# Patient Record
Sex: Female | Born: 1995 | Hispanic: Yes | Smoking: Never smoker
Health system: Southern US, Community
[De-identification: ages and names within clinical notes are randomized; demographics above are authoritative.]

---

## 2020-12-31 NOTE — L&D Delivery Note (Addendum)
OB/GYN Faculty Practice Delivery Note  Annette Thompson is a 25 y.o. G1P0 s/p SVD at [redacted]w[redacted]d. She was admitted for IOL for preE w/o SF.   ROM: 17h 89m with clear fluid GBS Status:  NEGATIVE/-- (07/05 1245) Maximum Maternal Temperature: 98.6  Labor Progress: Initial SVE: 1cm. Received cytotec and FB. Started on pit on 7/6 at 0300. AROM at 1100. Pit break from 1800-2200 and then restarted. She then progressed to complete.   Delivery Date/Time: 7/7 at 0400 Delivery: Called to room and patient was complete and pushing. Head delivered LOA. No nuchal cord present. Shoulder and body delivered in usual fashion. Infant with spontaneous cry, placed on mother's abdomen, dried and stimulated. Cord clamped x 2 after 1-minute delay, and cut by FOB. Cord blood drawn. Placenta delivered spontaneously with gentle cord traction. Fundus firm with massage and Pitocin and IM methergine. Labia, perineum, vagina, and cervix inspected inspected with first degree laceration, repaired.  Baby Weight: pending  Placenta: Sent to L&D Complications: None Lacerations: first degree, repaired EBL: 150 mL Analgesia: Epidural   Infant:  APGAR (1 MIN):  9 APGAR (5 MINS):  9 APGAR (10 MINS):     Casper Harrison, MD [facility] Family Medicine Fellow, [facility] for [facility], [facility] 07/06/2021, 4:27 AM

## 2021-01-13 ENCOUNTER — Other Ambulatory Visit: Payer: Self-pay

## 2021-01-13 ENCOUNTER — Emergency Department (HOSPITAL_COMMUNITY)
Admission: EM | Admit: 2021-01-13 | Discharge: 2021-01-13 | Disposition: A | Discharge status: Home / Self Care | Payer: BC Managed Care – PPO | Attending: Emergency Medicine | Admitting: Emergency Medicine

## 2021-01-13 ENCOUNTER — Encounter (HOSPITAL_COMMUNITY): Payer: Self-pay | Admitting: *Deleted

## 2021-01-13 ENCOUNTER — Ambulatory Visit: Payer: Self-pay

## 2021-01-13 DIAGNOSIS — R55 Syncope and collapse: Secondary | ICD-10-CM

## 2021-01-13 DIAGNOSIS — O26819 Pregnancy related exhaustion and fatigue, unspecified trimester: Secondary | ICD-10-CM | POA: Insufficient documentation

## 2021-01-13 DIAGNOSIS — Z3490 Encounter for supervision of normal pregnancy, unspecified, unspecified trimester: Secondary | ICD-10-CM

## 2021-01-13 DIAGNOSIS — Z3A00 Weeks of gestation of pregnancy not specified: Secondary | ICD-10-CM | POA: Diagnosis not present

## 2021-01-13 HISTORY — DX: Syncope and collapse: R55

## 2021-01-13 LAB — I-STAT CHEM 8, ED
BUN: 3 mg/dL — ABNORMAL LOW (ref 6–20)
Calcium, Ion: 1.21 mmol/L (ref 1.15–1.40)
Chloride: 103 mmol/L (ref 98–111)
Creatinine, Ser: 0.4 mg/dL — ABNORMAL LOW (ref 0.44–1.00)
Glucose, Bld: 82 mg/dL (ref 70–99)
HCT: 39 % (ref 36.0–46.0)
Hemoglobin: 13.3 g/dL (ref 12.0–15.0)
Potassium: 3.5 mmol/L (ref 3.5–5.1)
Sodium: 136 mmol/L (ref 135–145)
TCO2: 24 mmol/L (ref 22–32)

## 2021-01-13 LAB — I-STAT BETA HCG BLOOD, ED (MC, WL, AP ONLY): I-stat hCG, quantitative: >2000 m[IU]/mL — ABNORMAL HIGH (ref <5)

## 2021-01-13 MED ORDER — PRENATAL COMPLETE 14-0.4 MG PO TABS: 1.0000 | ORAL_TABLET | Freq: Every day | ORAL | 1 refills | Status: DC

## 2021-01-13 NOTE — Discharge Instructions (Addendum)
Start taking prenatal vitamins daily.  Follow-up with an OB/GYN doctor.

## 2021-01-13 NOTE — ED Triage Notes (Signed)
Syncopal episode at work on Wednesday, hit head, went to UC today and was sent here due to possible concussion. No more syncopal episodes since.

## 2021-01-13 NOTE — ED Notes (Signed)
ISTAT results not crossing over at this time. RN notified of results.

## 2021-01-13 NOTE — ED Provider Notes (Signed)
[facility]-EMERGENCY DEPT Provider Note   CSN: [id number] Arrival date & time: 01/13/21  1420     History Chief Complaint  Patient presents with  . Loss of Consciousness    Annette Thompson is a 25 y.o. female.  HPI   Patient presents to the ED for evaluation after a syncopal episode that she had at work on Wednesday.  Patient states she was at work when she suddenly started to feel very hot and flushed.  She felt lightheaded.  She felt as if she was going to pass out and the next thing she knew she was on the ground.  She thinks was only for a brief period of time but she did hit her head and does have a sore spot in the back of her head.  She denies a severe headache.  She is not having any numbness or weakness.  No nausea or vomiting.  No neck pain.  She has not had any further episodes since then.  Patient is not sure why this occurred.  She went to an urgent care today but they felt that she should be evaluated in the ED.  Patient has been vaccinated twice for COVID.  She is due to get a booster  History reviewed. No pertinent past medical history.  There are no problems to display for this patient.   History reviewed. No pertinent surgical history.   OB History   No obstetric history on file.     No family history on file.  Social History   Tobacco Use  . Smoking status: Never Smoker  . Smokeless tobacco: Never Used  Substance Use Topics  . Alcohol use: Never  . Drug use: Never    Home Medications Prior to Admission medications   Medication Sig Start Date End Date Taking? Authorizing Provider  Prenatal Vit-Fe Fumarate-FA (PRENATAL COMPLETE) 14-0.4 MG TABS Take 1 tablet by mouth daily. 01/13/21  Yes Linwood Dibbles, MD    Allergies    Patient has no known allergies.  Review of Systems   Review of Systems  All other systems reviewed and are negative.   Physical Exam Updated Vital Signs BP (!) 142/88 (BP Location: Left Arm)   Pulse (!)  109   Temp 98 F (36.7 C) (Oral)   Resp 16   Ht 1.549 m (5\' 1" )   Wt 70.3 kg   SpO2 98%   BMI 29.29 kg/m   Physical Exam Vitals and nursing note reviewed.  Constitutional:      General: She is not in acute distress.    Appearance: She is well-developed and well-nourished.  HENT:     Head: Normocephalic.     Comments: Mild tenderness palpation posterior occiput    Right Ear: External ear normal.     Left Ear: External ear normal.  Eyes:     General: No scleral icterus.       Right eye: No discharge.        Left eye: No discharge.     Conjunctiva/sclera: Conjunctivae normal.  Neck:     Trachea: No tracheal deviation.  Cardiovascular:     Rate and Rhythm: Normal rate and regular rhythm.     Pulses: Intact distal pulses.  Pulmonary:     Effort: Pulmonary effort is normal. No respiratory distress.     Breath sounds: Normal breath sounds. No stridor. No wheezing or rales.  Abdominal:     General: Bowel sounds are normal. There is no distension.  Palpations: Abdomen is soft.     Tenderness: There is no abdominal tenderness. There is no guarding or rebound.  Musculoskeletal:        General: No tenderness or edema.     Cervical back: Neck supple.  Skin:    General: Skin is warm and dry.     Findings: No rash.  Neurological:     Mental Status: She is alert.     Cranial Nerves: No cranial nerve deficit (no facial droop, extraocular movements intact, no slurred speech).     Sensory: No sensory deficit.     Motor: No abnormal muscle tone or seizure activity.     Coordination: Coordination normal.     Deep Tendon Reflexes: Strength normal.  Psychiatric:        Mood and Affect: Mood and affect normal.     ED Results / Procedures / Treatments   Labs (all labs ordered are listed, but only abnormal results are displayed) Labs Reviewed  I-STAT BETA HCG BLOOD, ED ([facility], [facility], [facility] ONLY) - Abnormal; Notable for the following components:      Result Value   I-stat hCG,  quantitative >2,000.0 (*)    All other components within normal limits  I-STAT CHEM 8, ED - Abnormal; Notable for the following components:   BUN 3 (*)    Creatinine, Ser 0.40 (*)    All other components within normal limits    EKG EKG Interpretation  Date/Time:  Friday January 13 2021 16:44:54 EST Ventricular Rate:  74 PR Interval:    QRS Duration: 75 QT Interval:  351 QTC Calculation: 390 R Axis:   47 Text Interpretation: Sinus rhythm Short PR interval 12 Lead; Mason-Likar No old tracing to compare Confirmed by Linwood Dibbles [phone]) on 01/13/2021 5:08:45 PM   Radiology No results found.  Procedures Procedures (including critical care time)  Medications Ordered in ED Medications - No data to display  ED Course  I have reviewed the triage vital signs and the nursing notes.  Pertinent labs & imaging results that were available during my care of the patient were reviewed by me and considered in my medical decision making (see chart for details).    MDM Rules/Calculators/A&P                         Patient presents to the ED for evaluation after a syncopal episode.  Patient appears of had a prodrome.  Sounds vasovagal in nature.  No clear trigger however.  We will plan on checking hemoglobin electrolytes and pregnancy test.  We will also add on EKG.  Regarding the head injury the syncopal episode preceded her head injury.  Patient is not having a severe headache.  She has not had any nausea vomiting.  It has been 2 days since the event.  I do not think head CT is necessary at this time.  Discussed this with the patient.  Patient's i-STAT shows normal hemoglobin.  Electrolytes are unremarkable.  EKG shows a normal rhythm.  Patient's pregnancy test is positive.  Suspect this was the contributing factor to her vasovagal episode.  Patient is not exactly sure when her last menstrual period was.  She is not complaining of abdominal pain.  No current vaginal bleeding.  Recommend outpatient  follow-up with OB/GYN.  Warning signs precautions discussed.  I will have patient start on prenatal vitamins. Final Clinical Impression(s) / ED Diagnoses Final diagnoses:  Pregnancy, unspecified gestational age  Syncope, unspecified syncope  type    Rx / DC Orders ED Discharge Orders         Ordered    Prenatal Vit-Fe Fumarate-FA (PRENATAL COMPLETE) 14-0.4 MG TABS  Daily        01/13/21 1729           Linwood Dibbles, MD 01/13/21 1732

## 2021-01-27 ENCOUNTER — Ambulatory Visit (INDEPENDENT_AMBULATORY_CARE_PROVIDER_SITE_OTHER): Payer: BC Managed Care – PPO

## 2021-01-27 ENCOUNTER — Other Ambulatory Visit: Payer: Self-pay

## 2021-01-27 VITALS — BP 122/74 | HR 81 | Ht 61.0 in | Wt 150.0 lb

## 2021-01-27 DIAGNOSIS — Z3402 Encounter for supervision of normal first pregnancy, second trimester: Secondary | ICD-10-CM

## 2021-01-27 DIAGNOSIS — O3680X0 Pregnancy with inconclusive fetal viability, not applicable or unspecified: Secondary | ICD-10-CM

## 2021-01-27 DIAGNOSIS — Z3403 Encounter for supervision of normal first pregnancy, third trimester: Secondary | ICD-10-CM | POA: Insufficient documentation

## 2021-01-27 DIAGNOSIS — Z3687 Encounter for antenatal screening for uncertain dates: Secondary | ICD-10-CM | POA: Diagnosis not present

## 2021-01-27 NOTE — Progress Notes (Signed)
PRENATAL INTAKE SUMMARY  Ms. Vonnie Spagnolo presents today New OB Nurse Interview.  OB History    Gravida  1   Para      Term      Preterm      AB      Living        SAB      IAB      Ectopic      Multiple      Live Births             I have reviewed the patient's medical, obstetrical, social, and family histories, medications, and available lab results.  SUBJECTIVE She has no unusual complaints  OBJECTIVE Initial Physical Exam (New OB)  GENERAL APPEARANCE: alert, well appearing   ASSESSMENT Normal pregnancy  PLAN Prenatal care to be completed at [place] labs to be completed at [facility] provider visit Blood Pressure kit given Baby scripts ordered PHQ 2 score: 0 GAD 7 score: 0 U/S performed today reveals single live IUP at 33w0dby u/s. FHR 159

## 2021-01-27 NOTE — Progress Notes (Signed)
Patient was assessed and managed by nursing staff during this encounter. I have reviewed the chart and agree with the documentation and plan. I have also made any necessary editorial changes.  Warden Fillers, MD 01/27/2021 12:09 PM

## 2021-02-14 ENCOUNTER — Encounter: Payer: Self-pay | Admitting: Obstetrics

## 2021-02-14 ENCOUNTER — Other Ambulatory Visit: Payer: Self-pay

## 2021-02-14 ENCOUNTER — Ambulatory Visit (INDEPENDENT_AMBULATORY_CARE_PROVIDER_SITE_OTHER): Payer: BC Managed Care – PPO | Admitting: Obstetrics

## 2021-02-14 ENCOUNTER — Other Ambulatory Visit (HOSPITAL_COMMUNITY)
Admission: RE | Admit: 2021-02-14 | Discharge: 2021-02-14 | Disposition: A | Discharge status: Home / Self Care | Payer: BC Managed Care – PPO | Source: Ambulatory Visit | Attending: Obstetrics | Admitting: Obstetrics

## 2021-02-14 VITALS — BP 122/82 | HR 85 | Wt 151.0 lb

## 2021-02-14 DIAGNOSIS — Z3482 Encounter for supervision of other normal pregnancy, second trimester: Secondary | ICD-10-CM | POA: Diagnosis present

## 2021-02-14 DIAGNOSIS — Z3480 Encounter for supervision of other normal pregnancy, unspecified trimester: Secondary | ICD-10-CM | POA: Diagnosis not present

## 2021-02-14 NOTE — Progress Notes (Signed)
Subjective:    Annette Thompson is being seen today for her first obstetrical visit.  This is a planned pregnancy. She is at [redacted]w[redacted]d gestation. Her obstetrical history is significant for none. Relationship with FOB: supportive. Patient does intend to breast feed. Pregnancy history fully reviewed.  The information documented in the HPI was reviewed and verified.  Menstrual History: OB History    Gravida  1   Para      Term      Preterm      AB      Living        SAB      IAB      Ectopic      Multiple      Live Births               Patient's last menstrual period was 10/16/2020.    Past Medical History:  Diagnosis Date  . Syncope 01/13/2021    History reviewed. No pertinent surgical history.  (Not in a hospital admission)  No Known Allergies  Social History   Tobacco Use  . Smoking status: Never Smoker  . Smokeless tobacco: Never Used  Substance Use Topics  . Alcohol use: Not Currently    History reviewed. No pertinent family history.   Review of Systems Constitutional: negative for weight loss Gastrointestinal: negative for vomiting Genitourinary:negative for genital lesions and vaginal discharge and dysuria Musculoskeletal:negative for back pain Behavioral/Psych: negative for abusive relationship, depression, illegal drug usage and tobacco use    Objective:    BP 122/82   Pulse 85   Wt 151 lb (68.5 kg)   LMP 10/16/2020   BMI 28.53 kg/m  General Appearance:    Alert, cooperative, no distress, appears stated age  Head:    Normocephalic, without obvious abnormality, atraumatic  Eyes:    PERRL, conjunctiva/corneas clear, EOM's intact, fundi    benign, both eyes  Ears:    Normal TM's and external ear canals, both ears  Nose:   Nares normal, septum midline, mucosa normal, no drainage    or sinus tenderness  Throat:   Lips, mucosa, and tongue normal; teeth and gums normal  Neck:   Supple, symmetrical, trachea midline, no adenopathy;    thyroid:   no enlargement/tenderness/nodules; no carotid   bruit or JVD  Back:     Symmetric, no curvature, ROM normal, no CVA tenderness  Lungs:     Clear to auscultation bilaterally, respirations unlabored  Chest Wall:    No tenderness or deformity   Heart:    Regular rate and rhythm, S1 and S2 normal, no murmur, rub   or gallop  Breast Exam:    No tenderness, masses, or nipple abnormality  Abdomen:     Soft, non-tender, bowel sounds active all four quadrants,    no masses, no organomegaly  Genitalia:    Normal female without lesion, discharge or tenderness  Extremities:   Extremities normal, atraumatic, no cyanosis or edema  Pulses:   2+ and symmetric all extremities  Skin:   Skin color, texture, turgor normal, no rashes or lesions  Lymph nodes:   Cervical, supraclavicular, and axillary nodes normal  Neurologic:   CNII-XII intact, normal strength, sensation and reflexes    throughout      Lab Review Urine pregnancy test Labs reviewed yes Radiologic studies reviewed no  Assessment:    Pregnancy at [redacted]w[redacted]d weeks    Plan:     1. Supervision of other normal pregnancy, antepartum Rx: -  Cytology - PAP( [place]) - CBC/D/Plt+RPR+Rh+ABO+Rub Ab... - Culture, OB Urine - Genetic Screening - AFP, Serum, Open Spina Bifida - [place] MFM OB COMP + 14 WK; Future  Prenatal vitamins.  Counseling provided regarding continued use of seat belts, cessation of alcohol consumption, smoking or use of illicit drugs; infection precautions i.e., influenza/TDAP immunizations, toxoplasmosis,CMV, parvovirus, listeria and varicella; workplace safety, exercise during pregnancy; routine dental care, safe medications, sexual activity, hot tubs, saunas, pools, travel, caffeine use, fish and methlymercury, potential toxins, hair treatments, varicose veins Weight gain recommendations per IOM guidelines reviewed: underweight/BMI< 18.5--> gain 28 - 40 lbs; normal weight/BMI 18.5 - 24.9--> gain 25 - 35 lbs; overweight/BMI 25 -  29.9--> gain 15 - 25 lbs; obese/BMI >30->gain  11 - 20 lbs Problem list reviewed and updated. FIRST/CF mutation testing/NIPT/QUAD SCREEN/fragile X/Ashkenazi Jewish population testing/Spinal muscular atrophy discussed: requested. Role of ultrasound in pregnancy discussed; fetal survey: requested. Amniocentesis discussed: not indicated.   Orders Placed This Encounter  Procedures  . Culture, OB Urine  . CBC/D/Plt+RPR+Rh+ABO+Rub Ab...  . Genetic Screening  . AFP, Serum, Open Spina Bifida    Order Specific Question:   Is patient insulin dependent?    Answer:   No    Order Specific Question:   Gestational Age (GA), weeks    Answer:   17.2    Order Specific Question:   Date on which patient was at this GA    Answer:   02/14/2021    Order Specific Question:   GA Calculation Method    Answer:   LMP    Order Specific Question:   Number of fetuses    Answer:   1    Follow up in 4 weeks. 50% of 20 min visit spent on counseling and coordination of care.    Brock Bad, MD 02/14/2021 3:50 PM

## 2021-02-15 ENCOUNTER — Other Ambulatory Visit (HOSPITAL_COMMUNITY)
Admission: RE | Admit: 2021-02-15 | Discharge: 2021-02-15 | Disposition: A | Discharge status: Home / Self Care | Payer: BC Managed Care – PPO | Source: Ambulatory Visit | Attending: Obstetrics | Admitting: Obstetrics

## 2021-02-15 DIAGNOSIS — B373 Candidiasis of vulva and vagina: Secondary | ICD-10-CM | POA: Insufficient documentation

## 2021-02-15 DIAGNOSIS — Z3A17 17 weeks gestation of pregnancy: Secondary | ICD-10-CM | POA: Insufficient documentation

## 2021-02-15 DIAGNOSIS — O23592 Infection of other part of genital tract in pregnancy, second trimester: Secondary | ICD-10-CM | POA: Insufficient documentation

## 2021-02-15 DIAGNOSIS — Z3480 Encounter for supervision of other normal pregnancy, unspecified trimester: Secondary | ICD-10-CM | POA: Diagnosis not present

## 2021-02-15 DIAGNOSIS — Z3482 Encounter for supervision of other normal pregnancy, second trimester: Secondary | ICD-10-CM | POA: Diagnosis present

## 2021-02-15 LAB — CYTOLOGY - PAP: Diagnosis: NEGATIVE

## 2021-02-15 NOTE — Addendum Note (Signed)
Addended by: Kennon Portela on: 02/15/2021 10:49 AM   Modules accepted: Orders

## 2021-02-16 LAB — CERVICOVAGINAL ANCILLARY ONLY
Bacterial Vaginitis (gardnerella): NEGATIVE
Candida Glabrata: NEGATIVE
Candida Vaginitis: POSITIVE — AB
Chlamydia: NEGATIVE
Comment: NEGATIVE
Comment: NEGATIVE
Comment: NEGATIVE
Comment: NEGATIVE
Comment: NEGATIVE
Comment: NORMAL
Neisseria Gonorrhea: NEGATIVE
Trichomonas: NEGATIVE

## 2021-02-16 LAB — CULTURE, OB URINE

## 2021-02-16 LAB — URINE CULTURE, OB REFLEX

## 2021-02-17 LAB — CBC/D/PLT+RPR+RH+ABO+RUB AB...
Antibody Screen: NEGATIVE
Basophils Absolute: 0.1 10*3/uL (ref 0.0–0.2)
Basos: 1 %
EOS (ABSOLUTE): 0.1 10*3/uL (ref 0.0–0.4)
Eos: 1 %
HCV Ab: <0.1 s/co ratio (ref 0.0–0.9)
HIV Screen 4th Generation wRfx: NONREACTIVE
Hematocrit: 36.1 % (ref 34.0–46.6)
Hemoglobin: 12.1 g/dL (ref 11.1–15.9)
Hepatitis B Surface Ag: NEGATIVE
Immature Grans (Abs): 0 10*3/uL (ref 0.0–0.1)
Immature Granulocytes: 0 %
Lymphocytes Absolute: 2 10*3/uL (ref 0.7–3.1)
Lymphs: 20 %
MCH: 28.5 pg (ref 26.6–33.0)
MCHC: 33.5 g/dL (ref 31.5–35.7)
MCV: 85 fL (ref 79–97)
Monocytes Absolute: 0.5 10*3/uL (ref 0.1–0.9)
Monocytes: 4 %
Neutrophils Absolute: 7.6 10*3/uL — ABNORMAL HIGH (ref 1.4–7.0)
Neutrophils: 74 %
Platelets: 181 10*3/uL (ref 150–450)
RBC: 4.24 x10E6/uL (ref 3.77–5.28)
RDW: 13.3 % (ref 11.7–15.4)
RPR Ser Ql: REACTIVE — AB
Rh Factor: POSITIVE
Rubella Antibodies, IGG: <0.9 index — ABNORMAL LOW (ref >0.99)
WBC: 10.2 10*3/uL (ref 3.4–10.8)

## 2021-02-17 LAB — HCV INTERPRETATION

## 2021-02-17 LAB — AFP, SERUM, OPEN SPINA BIFIDA
AFP MoM: 1.23
AFP Value: 48.4 ng/mL
Gest. Age on Collection Date: 17.2 weeks
Maternal Age At EDD: 25.7 yr
OSBR Risk 1 IN: 5926
Test Results:: NEGATIVE
Weight: 151 [lb_av]

## 2021-02-17 LAB — RPR, QUANT+TP ABS (REFLEX)
Rapid Plasma Reagin, Quant: 1:1 {titer} — ABNORMAL HIGH
T Pallidum Abs: NONREACTIVE

## 2021-02-21 ENCOUNTER — Encounter: Payer: Self-pay | Admitting: Obstetrics

## 2021-02-28 ENCOUNTER — Encounter: Payer: Self-pay | Admitting: Obstetrics

## 2021-03-02 ENCOUNTER — Ambulatory Visit: Payer: BC Managed Care – PPO | Attending: Obstetrics and Gynecology

## 2021-03-02 ENCOUNTER — Other Ambulatory Visit: Payer: Self-pay

## 2021-03-02 DIAGNOSIS — Z3480 Encounter for supervision of other normal pregnancy, unspecified trimester: Secondary | ICD-10-CM | POA: Diagnosis present

## 2021-03-14 ENCOUNTER — Ambulatory Visit (INDEPENDENT_AMBULATORY_CARE_PROVIDER_SITE_OTHER): Payer: BC Managed Care – PPO | Admitting: Obstetrics and Gynecology

## 2021-03-14 ENCOUNTER — Other Ambulatory Visit: Payer: Self-pay

## 2021-03-14 VITALS — BP 123/78 | HR 86 | Wt 153.0 lb

## 2021-03-14 DIAGNOSIS — Z3480 Encounter for supervision of other normal pregnancy, unspecified trimester: Secondary | ICD-10-CM

## 2021-03-14 DIAGNOSIS — Z3A21 21 weeks gestation of pregnancy: Secondary | ICD-10-CM

## 2021-03-14 MED ORDER — CLOTRIMAZOLE 1 % VA CREA: 1.0000 | TOPICAL_CREAM | Freq: Every day | VAGINAL | 0 refills | Status: AC

## 2021-03-14 NOTE — Progress Notes (Signed)
° °  PRENATAL VISIT NOTE  Subjective:  Annette Thompson is a 25 y.o. G1P0 at [redacted]w[redacted]d being seen today for ongoing prenatal care.  She is currently monitored for the following issues for this low-risk pregnancy and has Encounter for supervision of normal first pregnancy in second trimester on their problem list.  Patient reports vaginal discharge. Had tested positive for candida at last visit but was not given medication. Otherwise doing well.  Contractions: Not present. Vag. Bleeding: None.  Movement: Present. Denies leaking of fluid.   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   The following portions of the patient's history were reviewed and updated as appropriate: allergies, current medications, past family history, past medical history, past social history, past surgical history and problem list.   Objective:   Vitals:   03/14/21 1547  BP: 123/78  Pulse: 86  Weight: 153 lb (69.4 kg)    Fetal Status: Fetal Heart Rate (bpm): 156   Movement: Present     General:  Alert, oriented and cooperative. Patient is in no acute distress.  Skin: Skin is warm and dry. No rash noted.   Cardiovascular: Normal heart rate noted  Respiratory: Normal respiratory effort, no problems with respiration noted  Abdomen: Soft, gravid, appropriate for gestational age.  Pain/Pressure: Absent     Pelvic: Cervical exam deferred        Extremities: Normal range of motion.  Edema: None  Mental Status: Normal mood and affect. Normal behavior. Normal judgment and thought content.   Assessment and Plan:  Pregnancy: G1P0 at [redacted]w[redacted]d 1. Supervision of other normal pregnancy, antepartum -Doing well overall. Normal anatomy scan.  -script sent for vaginal clotrimazole for yeast infection  -  2. [redacted] weeks gestation of pregnancy   Preterm labor symptoms and general obstetric precautions including but not limited to vaginal bleeding, contractions, leaking of fluid and fetal movement were reviewed in detail with the patient. Please  refer to After Visit Summary for other counseling recommendations.   Return in about 4 weeks (around 04/11/2021) for OB.  No future appointments.  Gita Kudo, MD

## 2021-03-14 NOTE — Progress Notes (Signed)
+   Fetal movement. Pt is c/o increased acid reflux.

## 2021-03-14 NOTE — Patient Instructions (Addendum)
Safe Medications in Pregnancy   Acne:  Benzoyl Peroxide  Salicylic Acid   Backache/Headache:  Tylenol: 2 regular strength every 4 hours OR        2 Extra strength every 6 hours   Colds/Coughs/Allergies:  Benadryl (alcohol free) 25 mg every 6 hours as needed  Breath right strips  Claritin  Cepacol throat lozenges  Chloraseptic throat spray  Cold-Eeze- up to three times per day  Cough drops, alcohol free  Flonase (by prescription only)  Guaifenesin  Mucinex  Robitussin DM (plain only, alcohol free)  Saline nasal spray/drops  Sudafed (pseudoephedrine) & Actifed * use only after [redacted] weeks gestation and if you do not have high blood pressure  Tylenol  Vicks Vaporub  Zinc lozenges  Zyrtec   Constipation:  Colace  Ducolax suppositories  Fleet enema  Glycerin suppositories  Metamucil  Milk of magnesia  Miralax  Senokot  Smooth move tea   Diarrhea:  Kaopectate  Imodium A-D   *NO pepto Bismol   Hemorrhoids:  Anusol  Anusol HC  Preparation H  Tucks   Indigestion:  Tums  Maalox  Mylanta  Zantac  Pepcid   Insomnia:  Benadryl (alcohol free) 25mg  every 6 hours as needed  Tylenol PM  Unisom, no Gelcaps   Leg Cramps:  Tums  MagGel   Nausea/Vomiting:  Bonine  Dramamine  Emetrol  Ginger extract  Sea bands  Meclizine  Nausea medication to take during pregnancy:  Unisom (doxylamine succinate 25 mg tablets) Take one tablet daily at bedtime. If symptoms are not adequately controlled, the dose can be increased to a maximum recommended dose of two tablets daily (1/2 tablet in the morning, 1/2 tablet mid-afternoon and one at bedtime).  Vitamin B6 100mg  tablets. Take one tablet twice a day (up to 200 mg per day).   Skin Rashes:  Aveeno products  Benadryl cream or 25mg  every 6 hours as needed  Calamine Lotion  1% cortisone cream   Yeast infection:  Gyne-lotrimin 7 day course Monistat 7 day course    **If taking multiple medications, please check  labels to avoid duplicating the same active ingredients  **take medication as directed on the label  ** Do not exceed 4000 mg of tylenol in 24 hours  **Do not take medications that contain aspirin or ibuprofen

## 2021-03-14 NOTE — Addendum Note (Signed)
Addended by: Charlsie Quest B on: 03/14/2021 04:09 PM   Modules accepted: Orders

## 2021-03-16 LAB — AFP, SERUM, OPEN SPINA BIFIDA
AFP MoM: 1.32
AFP Value: 87 ng/mL
Gest. Age on Collection Date: 21.2 weeks
Maternal Age At EDD: 25.7 yr
OSBR Risk 1 IN: 4529
Test Results:: NEGATIVE
Weight: 153 [lb_av]

## 2021-04-11 ENCOUNTER — Other Ambulatory Visit: Payer: Self-pay

## 2021-04-11 ENCOUNTER — Ambulatory Visit (INDEPENDENT_AMBULATORY_CARE_PROVIDER_SITE_OTHER): Payer: BC Managed Care – PPO

## 2021-04-11 ENCOUNTER — Encounter: Payer: Self-pay | Admitting: Nurse Practitioner

## 2021-04-11 VITALS — BP 118/77 | HR 94 | Wt 159.0 lb

## 2021-04-11 DIAGNOSIS — Z3402 Encounter for supervision of normal first pregnancy, second trimester: Secondary | ICD-10-CM

## 2021-04-11 DIAGNOSIS — R12 Heartburn: Secondary | ICD-10-CM

## 2021-04-11 DIAGNOSIS — Z3A25 25 weeks gestation of pregnancy: Secondary | ICD-10-CM

## 2021-04-11 DIAGNOSIS — O26892 Other specified pregnancy related conditions, second trimester: Secondary | ICD-10-CM

## 2021-04-11 NOTE — Progress Notes (Signed)
ROB [redacted]w[redacted]d  AFP done 03/14/21  Pt has FMLA forms pt made aware to drop forms off at check out they will make her aware of fee and get her name and info then give to nurse that handles FMLA. Pt asked to allow 7-10 business days.   CC: None

## 2021-04-11 NOTE — Progress Notes (Signed)
   PRENATAL VISIT NOTE  Subjective:  Annette Thompson is a 25 y.o. G1P0 at [redacted]w[redacted]d being seen today for ongoing prenatal care.  She is currently monitored for the following issues for this low-risk pregnancy and has Encounter for supervision of normal first pregnancy in second trimester on their problem list.  Patient reports worsening heartburn. Uses Tums which sometimes relieves it. .  Contractions: Not present. Vag. Bleeding: None.  Movement: Present. Denies leaking of fluid.   The following portions of the patient's history were reviewed and updated as appropriate: allergies, current medications, past family history, past medical history, past social history, past surgical history and problem list.   Objective:   Vitals:   04/11/21 1557  BP: 118/77  Pulse: 94  Weight: 159 lb (72.1 kg)    Fetal Status: Fetal Heart Rate (bpm): 154 Fundal Height: 25 cm Movement: Present     General:  Alert, oriented and cooperative. Patient is in no acute distress.  Skin: Skin is warm and dry. No rash noted.   Cardiovascular: Normal heart rate noted  Respiratory: Normal respiratory effort, no problems with respiration noted  Abdomen: Soft, gravid, appropriate for gestational age.  Pain/Pressure: Present     Pelvic: Cervical exam deferred        Extremities: Normal range of motion.  Edema: None  Mental Status: Normal mood and affect. Normal behavior. Normal judgment and thought content.   Assessment and Plan:  Pregnancy: G1P0 at [redacted]w[redacted]d 1. Encounter for supervision of normal first pregnancy in second trimester - Doing well - Anticipatory guidance for upcoming prenatal appointments - GTT and 28 week labs at next visit  2. [redacted] weeks gestation of pregnancy  3. Heartburn during pregnancy in second trimester - Uses Tums with some relief - Recommend Pepcid BID - Avoid trigger foods, encouraged small, frequent meals, avoid lying down after eating  Preterm labor symptoms and general obstetric  precautions including but not limited to vaginal bleeding, contractions, leaking of fluid and fetal movement were reviewed in detail with the patient. Please refer to After Visit Summary for other counseling recommendations.   Return in about 3 weeks (around 05/02/2021) for ROB and gtt.  Future Appointments  Date Time Provider Department Center  05/02/2021  8:00 AM [facility] None  05/02/2021 10:15 AM Hermina Staggers, MD [facility] None    Brand Males, CNM  4:26 PM

## 2021-04-24 ENCOUNTER — Telehealth: Payer: Self-pay

## 2021-04-24 NOTE — Telephone Encounter (Signed)
FMLA forms are complete LVM for pt to c/b for payment

## 2021-05-02 ENCOUNTER — Encounter: Payer: BC Managed Care – PPO | Admitting: Obstetrics and Gynecology

## 2021-05-02 ENCOUNTER — Other Ambulatory Visit: Payer: BC Managed Care – PPO

## 2021-05-05 ENCOUNTER — Other Ambulatory Visit: Payer: Self-pay

## 2021-05-05 ENCOUNTER — Other Ambulatory Visit: Payer: BC Managed Care – PPO

## 2021-05-05 ENCOUNTER — Ambulatory Visit (INDEPENDENT_AMBULATORY_CARE_PROVIDER_SITE_OTHER): Payer: BC Managed Care – PPO | Admitting: Student

## 2021-05-05 VITALS — BP 106/68 | HR 81 | Wt 159.0 lb

## 2021-05-05 DIAGNOSIS — Z3A28 28 weeks gestation of pregnancy: Secondary | ICD-10-CM

## 2021-05-05 DIAGNOSIS — Z3402 Encounter for supervision of normal first pregnancy, second trimester: Secondary | ICD-10-CM

## 2021-05-05 NOTE — Progress Notes (Signed)
   PRENATAL VISIT NOTE  Subjective:  Annette Thompson is a 25 y.o. G1P0 at [redacted]w[redacted]d being seen today for ongoing prenatal care.  She is currently monitored for the following issues for this low-risk pregnancy and has Encounter for supervision of normal first pregnancy in second trimester on their problem list.  Patient reports no complaints. She is asking about circucision and who will deliver her baby.   Contractions: Not present. Vag. Bleeding: None.  Movement: Present. Denies leaking of fluid.   The following portions of the patient's history were reviewed and updated as appropriate: allergies, current medications, past family history, past medical history, past social history, past surgical history and problem list.   Objective:   Vitals:   05/05/21 0820  BP: 106/68  Pulse: 81  Weight: 159 lb (72.1 kg)    Fetal Status: Fetal Heart Rate (bpm): 140 Fundal Height: 28 cm Movement: Present     General:  Alert, oriented and cooperative. Patient is in no acute distress.  Skin: Skin is warm and dry. No rash noted.   Cardiovascular: Normal heart rate noted  Respiratory: Normal respiratory effort, no problems with respiration noted  Abdomen: Soft, gravid, appropriate for gestational age.  Pain/Pressure: Absent     Pelvic: Cervical exam deferred        Extremities: Normal range of motion.     Mental Status: Normal mood and affect. Normal behavior. Normal judgment and thought content.   Assessment and Plan:  Pregnancy: G1P0 at [redacted]w[redacted]d 1. Encounter for supervision of normal first pregnancy in second trimester -Discussed circ, discussed depo shot/.  - Glucose Tolerance, 2 Hours w/1 Hour - CBC - HIV Antibody (routine testing w rflx) - RPR  Preterm labor symptoms and general obstetric precautions including but not limited to vaginal bleeding, contractions, leaking of fluid and fetal movement were reviewed in detail with the patient. Please refer to After Visit Summary for other counseling  recommendations.   Return in about 2 weeks (around 05/19/2021), or [facility].  No future appointments.  Marylene Land, CNM

## 2021-05-06 LAB — GLUCOSE TOLERANCE, 2 HOURS W/ 1HR
Glucose, 1 hour: 106 mg/dL (ref 65–179)
Glucose, 2 hour: 84 mg/dL (ref 65–152)
Glucose, Fasting: 74 mg/dL (ref 65–91)

## 2021-05-06 LAB — HIV ANTIBODY (ROUTINE TESTING W REFLEX): HIV Screen 4th Generation wRfx: NONREACTIVE

## 2021-05-06 LAB — CBC
Hematocrit: 35.4 % (ref 34.0–46.6)
Hemoglobin: 12.2 g/dL (ref 11.1–15.9)
MCH: 29.5 pg (ref 26.6–33.0)
MCHC: 34.5 g/dL (ref 31.5–35.7)
MCV: 86 fL (ref 79–97)
Platelets: 146 10*3/uL — ABNORMAL LOW (ref 150–450)
RBC: 4.13 x10E6/uL (ref 3.77–5.28)
RDW: 12.6 % (ref 11.7–15.4)
WBC: 7.6 10*3/uL (ref 3.4–10.8)

## 2021-05-06 LAB — RPR: RPR Ser Ql: NONREACTIVE

## 2021-05-08 ENCOUNTER — Encounter: Payer: Self-pay | Admitting: Student

## 2021-05-08 DIAGNOSIS — R768 Other specified abnormal immunological findings in serum: Secondary | ICD-10-CM | POA: Insufficient documentation

## 2021-05-08 DIAGNOSIS — O09899 Supervision of other high risk pregnancies, unspecified trimester: Secondary | ICD-10-CM | POA: Insufficient documentation

## 2021-05-08 DIAGNOSIS — Z2839 Other underimmunization status: Secondary | ICD-10-CM | POA: Insufficient documentation

## 2021-05-08 DIAGNOSIS — D696 Thrombocytopenia, unspecified: Secondary | ICD-10-CM | POA: Insufficient documentation

## 2021-05-18 ENCOUNTER — Other Ambulatory Visit: Payer: Self-pay

## 2021-05-18 ENCOUNTER — Ambulatory Visit (INDEPENDENT_AMBULATORY_CARE_PROVIDER_SITE_OTHER): Payer: BC Managed Care – PPO

## 2021-05-18 VITALS — BP 120/81 | HR 96 | Wt 161.0 lb

## 2021-05-18 DIAGNOSIS — Z3A30 30 weeks gestation of pregnancy: Secondary | ICD-10-CM

## 2021-05-18 DIAGNOSIS — D696 Thrombocytopenia, unspecified: Secondary | ICD-10-CM

## 2021-05-18 DIAGNOSIS — O99119 Other diseases of the blood and blood-forming organs and certain disorders involving the immune mechanism complicating pregnancy, unspecified trimester: Secondary | ICD-10-CM

## 2021-05-18 DIAGNOSIS — Z3402 Encounter for supervision of normal first pregnancy, second trimester: Secondary | ICD-10-CM

## 2021-05-18 NOTE — Progress Notes (Signed)
Pt has had a couple episodes of seeing spots, no other associated symptoms.

## 2021-05-18 NOTE — Progress Notes (Signed)
   LOW-RISK PREGNANCY OFFICE VISIT  Patient name: Annette Thompson MRN [id number]  Date of birth: 1996-04-02 Chief Complaint:   Routine Prenatal Visit  Subjective:   Annette Thompson is a 25 y.o. G1P0 female at [redacted]w[redacted]d with an Estimated Date of Delivery: 07/23/21 being seen today for ongoing management of a low-risk pregnancy aeb has Encounter for supervision of normal first pregnancy in second trimester; Biological false positive RPR test; Rubella non-immune status, antepartum; and Gestational thrombocytopenia (HCC) on their problem list.  Patient presents today with no complaints. Patient reports excitement with experiencing hiccups.  Patient endorses fetal movement. Patient denies abdominal cramping or contractions.  Patient denies vaginal concerns including abnormal discharge, leaking of fluid, and bleeding.  Contractions: Not present. Vag. Bleeding: None.  Movement: Present.  Patient reports a few incidents of seeing spots on 3 separate occassions.  She contributes it to inadequate hydration.   Reviewed past medical,surgical, social, obstetrical and family history as well as problem list, medications and allergies.  Objective   Vitals:   05/18/21 1604  BP: 120/81  Pulse: 96  Weight: 161 lb (73 kg)  Body mass index is 30.42 kg/m.  Total Weight Gain:1 lb (0.454 kg)         Physical Examination:   General appearance: Well appearing, and in no distress  Mental status: Alert, oriented to person, place, and time  Skin: Warm & dry  Cardiovascular: Normal heart rate noted  Respiratory: Normal respiratory effort, no distress  Abdomen: Soft, gravid, nontender, AGA with Fundal Height: 33 cm  Pelvic: Cervical exam deferred           Extremities:    Fetal Status: Fetal Heart Rate (bpm): 140  Movement: Present   No results found for this or any previous visit (from the past 24 hour(s)).  Assessment & Plan:  Low-risk pregnancy of a 25 y.o., G1P0 at [redacted]w[redacted]d with an Estimated Date of  Delivery: 07/23/21   1. Encounter for supervision of normal first pregnancy in second trimester -Anticipatory guidance for upcoming appts. -Patient to next appt in 2 weeks for an in-person visit. -Discussed FH at 49 which is slightly larger than expected. -Anatomy US fetus in 95%ile.  Will monitor and send for growth [place] if trend noted.   2. [redacted] weeks gestation of pregnancy -Doing well -Encouraged to monitor symptoms regarding spotty vision. -Given suggestion for eye exam!  3. Benign gestational thrombocytopenia, antepartum [facility]) -Reviewed previous labs and diagnosis of G. Thrombocytopenia -Informed that we will repeat labs at around 36 weeks. -Given informational sheet with warning that it is about general thrombocytopenia.      Meds: No orders of the defined types were placed in this encounter.  Labs/procedures today:  Lab Orders  No laboratory test(s) ordered today     Reviewed: Preterm labor symptoms and general obstetric precautions including but not limited to vaginal bleeding, contractions, leaking of fluid and fetal movement were reviewed in detail with the patient.  All questions were answered.  Follow-up: Return in about 2 weeks (around 06/01/2021) for LROB.  No orders of the defined types were placed in this encounter.  Cherre Robins MSN, CNM 05/18/2021

## 2021-05-18 NOTE — Patient Instructions (Signed)
Thrombocytopenia Thrombocytopenia is a condition in which you have a low number of platelets in your blood. Platelets are also called thrombocytes. Platelets are tiny cells in the blood. When you bleed, they clump together at the cut or injury to stop the bleeding. This is called blood clotting. Not having enough platelets can cause bleeding problems. Some cases of thrombocytopenia are mild while others are more severe. What are the causes? This condition may be caused by:  Decreased production of platelets. This may be caused by: ? Aplastic anemia. This is when your bone marrow stops making blood cells. ? Cancer in the bone marrow. ? Certain medicines, including chemotherapy. ? Infection in the bone marrow. ? Drinking a lot of alcohol.  Increased destruction of platelets. This may be caused by: ? Certain immune diseases. ? Certain medicines. ? Certain blood clotting disorders. ? Certain inherited disorders. ? Certain bleeding disorders. ? Pregnancy. ? Having an enlarged spleen (hypersplenism). In hypersplenism, the spleen gathers up platelets from circulation. This means that the platelets are not available to help with blood clotting. The spleen can be enlarged because of cirrhosis or other conditions. What are the signs or symptoms? Symptoms of this condition are the result of poor blood clotting. They will vary depending on how low the platelet counts are. Symptoms may include:  Abnormal bleeding.  Nosebleeds.  Heavy menstrual periods.  Blood in the urine or stool (feces).  A purplish discoloration in the skin (purpura).  Bruising.  A rash that looks like pinpoint, purplish-red spots (petechiae) on the skin and mucous membranes. How is this diagnosed? This condition may be diagnosed with blood tests and a physical exam. Sometimes, a sample of bone marrow may be removed to look for the original cells (megakaryocytes) that make platelets. Other tests may be needed depending on  the cause.   How is this treated? Treatment for this condition depends on the cause. Treatment options may include:  Treatment of another condition that is causing the low platelet count.  Medicines to help protect your platelets from being destroyed.  A replacement (transfusion) of platelets to stop or prevent bleeding.  Surgery to remove the spleen. Follow these instructions at home: Activity  Avoid activities that could cause injury or bruising, and follow instructions about how to prevent falls.  Take extra care not to cut yourself when you shave or when you use scissors, needles, knives, and other tools.  Take extra care to protect yourself from burns when ironing or cooking. General instructions  Check your skin and the inside of your mouth for bruising or bleeding as told by your health care provider.  Check your spit (sputum), urine, and stool for blood as told by your health care provider.  Do not drink alcohol.  Take over-the-counter and prescription medicines only as told by your health care provider.  Do not take any medicines that have aspirin or NSAIDs in them. These medicines can thin your blood and cause you to bleed more easily.  Tell all your health care providers, including dentists and eye doctors, about your condition.   Contact a health care provider if you have:  Unexplained bruising. Get help right away if you have:  Active bleeding from anywhere on your body.  Blood in your sputum, urine, or stool. Summary  Thrombocytopenia is a condition in which you have a low number of platelets in your blood.  Platelets are needed for blood clotting.  Symptoms of this condition are the result of poor blood   clotting and may include abnormal bleeding, nosebleeds, and bruising.  This condition may be diagnosed with blood tests and a physical exam.  Treatment for this condition depends on the cause. This information is not intended to replace advice given to  you by your health care provider. Make sure you discuss any questions you have with your health care provider. Document Revised: 09/18/2018 Document Reviewed: 09/18/2018 Elsevier Patient Education  2021 Elsevier Inc.  

## 2021-05-24 DIAGNOSIS — Z3480 Encounter for supervision of other normal pregnancy, unspecified trimester: Secondary | ICD-10-CM

## 2021-05-25 MED ORDER — PRENATAL COMPLETE 14-0.4 MG PO TABS: 1.0000 | ORAL_TABLET | Freq: Every day | ORAL | 3 refills | Status: DC

## 2021-06-01 ENCOUNTER — Other Ambulatory Visit: Payer: Self-pay

## 2021-06-01 ENCOUNTER — Ambulatory Visit (INDEPENDENT_AMBULATORY_CARE_PROVIDER_SITE_OTHER): Payer: BC Managed Care – PPO | Admitting: Advanced Practice Midwife

## 2021-06-01 VITALS — BP 126/80 | HR 90 | Wt 163.9 lb

## 2021-06-01 DIAGNOSIS — Z3480 Encounter for supervision of other normal pregnancy, unspecified trimester: Secondary | ICD-10-CM

## 2021-06-01 DIAGNOSIS — D696 Thrombocytopenia, unspecified: Secondary | ICD-10-CM

## 2021-06-01 DIAGNOSIS — O99119 Other diseases of the blood and blood-forming organs and certain disorders involving the immune mechanism complicating pregnancy, unspecified trimester: Secondary | ICD-10-CM

## 2021-06-01 DIAGNOSIS — Z3A32 32 weeks gestation of pregnancy: Secondary | ICD-10-CM

## 2021-06-01 NOTE — Patient Instructions (Signed)

## 2021-06-01 NOTE — Progress Notes (Signed)
   PRENATAL VISIT NOTE  Subjective:  Annette Thompson is a 25 y.o. G1P0 at [redacted]w[redacted]d being seen today for ongoing prenatal care.  She is currently monitored for the following issues for this low-risk pregnancy and has Encounter for supervision of normal first pregnancy in second trimester; Biological false positive RPR test; Rubella non-immune status, antepartum; and Gestational thrombocytopenia (HCC) on their problem list.  Patient reports no complaints.  Contractions: Not present. Vag. Bleeding: None.  Movement: Present. Denies leaking of fluid.   The following portions of the patient's history were reviewed and updated as appropriate: allergies, current medications, past family history, past medical history, past social history, past surgical history and problem list.   Objective:   Vitals:   06/01/21 1627  BP: 126/80  Pulse: 90  Weight: 163 lb 14.4 oz (74.3 kg)    Fetal Status: Fetal Heart Rate (bpm): 152 Fundal Height: 32 cm Movement: Present     General:  Alert, oriented and cooperative. Patient is in no acute distress.  Skin: Skin is warm and dry. No rash noted.   Cardiovascular: Normal heart rate noted  Respiratory: Normal respiratory effort, no problems with respiration noted  Abdomen: Soft, gravid, appropriate for gestational age.  Pain/Pressure: Absent     Pelvic: Cervical exam deferred        Extremities: Normal range of motion.  Edema: None  Mental Status: Normal mood and affect. Normal behavior. Normal judgment and thought content.   Assessment and Plan:  Pregnancy: G1P0 at [redacted]w[redacted]d 1. Supervision of other normal pregnancy, antepartum --Anticipatory guidance about next visits/weeks of pregnancy given. --Next visit in 2 weeks  2. [redacted] weeks gestation of pregnancy   3. Benign gestational thrombocytopenia, antepartum (HCC) --Plts 146 on 5/6, recheck at 36 weeks  Preterm labor symptoms and general obstetric precautions including but not limited to vaginal bleeding,  contractions, leaking of fluid and fetal movement were reviewed in detail with the patient. Please refer to After Visit Summary for other counseling recommendations.   Return in about 2 weeks (around 06/15/2021).  Future Appointments  Date Time Provider Department Center  06/15/2021  1:30 PM Marsala, Arlana Pouch, MD [facility] None    Sharen Counter, CNM

## 2021-06-15 ENCOUNTER — Encounter: Payer: BC Managed Care – PPO | Admitting: Obstetrics and Gynecology

## 2021-06-19 ENCOUNTER — Other Ambulatory Visit: Payer: Self-pay | Admitting: Advanced Practice Midwife

## 2021-06-19 ENCOUNTER — Other Ambulatory Visit: Payer: Self-pay

## 2021-06-19 ENCOUNTER — Ambulatory Visit (INDEPENDENT_AMBULATORY_CARE_PROVIDER_SITE_OTHER): Payer: BC Managed Care – PPO | Admitting: Advanced Practice Midwife

## 2021-06-19 VITALS — BP 120/85 | HR 87 | Wt 167.0 lb

## 2021-06-19 DIAGNOSIS — G5600 Carpal tunnel syndrome, unspecified upper limb: Secondary | ICD-10-CM

## 2021-06-19 DIAGNOSIS — O26899 Other specified pregnancy related conditions, unspecified trimester: Secondary | ICD-10-CM

## 2021-06-19 DIAGNOSIS — D696 Thrombocytopenia, unspecified: Secondary | ICD-10-CM

## 2021-06-19 DIAGNOSIS — O1203 Gestational edema, third trimester: Secondary | ICD-10-CM

## 2021-06-19 DIAGNOSIS — Z3480 Encounter for supervision of other normal pregnancy, unspecified trimester: Secondary | ICD-10-CM

## 2021-06-19 DIAGNOSIS — O99119 Other diseases of the blood and blood-forming organs and certain disorders involving the immune mechanism complicating pregnancy, unspecified trimester: Secondary | ICD-10-CM

## 2021-06-19 MED ORDER — PRENATAL COMPLETE 14-0.4 MG PO TABS: 1.0000 | ORAL_TABLET | Freq: Every day | ORAL | 3 refills | Status: AC

## 2021-06-19 NOTE — Progress Notes (Signed)
   PRENATAL VISIT NOTE  Subjective:  Annette Thompson is a 25 y.o. G1P0 at [redacted]w[redacted]d being seen today for ongoing prenatal care.  She is currently monitored for the following issues for this low-risk pregnancy and has Encounter for supervision of normal first pregnancy in second trimester; Biological false positive RPR test; Rubella non-immune status, antepartum; and Gestational thrombocytopenia (HCC) on their problem list.  Patient reports  edema .  Contractions: Not present. Vag. Bleeding: None.  Movement: Present. Denies leaking of fluid.   The following portions of the patient's history were reviewed and updated as appropriate: allergies, current medications, past family history, past medical history, past social history, past surgical history and problem list.   Objective:   Vitals:   06/19/21 1346  BP: 120/85  Pulse: 87  Weight: 167 lb (75.8 kg)    Fetal Status: Fetal Heart Rate (bpm): 140   Movement: Present     General:  Alert, oriented and cooperative. Patient is in no acute distress.  Skin: Skin is warm and dry. No rash noted.   Cardiovascular: Normal heart rate noted  Respiratory: Normal respiratory effort, no problems with respiration noted  Abdomen: Soft, gravid, appropriate for gestational age.  Pain/Pressure: Present     Pelvic: Cervical exam deferred        Extremities: Normal range of motion.  Edema: Trace  Mental Status: Normal mood and affect. Normal behavior. Normal judgment and thought content.   Assessment and Plan:  Pregnancy: G1P0 at [redacted]w[redacted]d 1. Benign gestational thrombocytopenia, antepartum (HCC) --Plts stable at 146 on 5/6.  Repeat today. - CBC  2. Supervision of other normal pregnancy, antepartum --Anticipatory guidance about next visits/weeks of pregnancy given. --Next visit in 2 weeks for GBS  - Prenatal Vit-Fe Fumarate-FA (PRENATAL COMPLETE) 14-0.4 MG TABS; Take 1 tablet by mouth daily.  Dispense: 60 tablet; Refill: 3  3. Edema during pregnancy in  third trimester --+2 Pitting BLE edema, no upper extremity edema. --BP wnl, no s/sx of PEC --elevate, compression, decrease sodium intake and increase PO fluids --Pt had field trip as teacher 2 weeks, ago, last week was travelling via plane to [place], plans to rest, elevate more this week. --Take BP on home cuff weekly, reviewed s/sx of PEC  4. Carpal tunnel syndrome during pregnancy --No visible edema of hands, pt wearing prepregnancy rings without difficulty. --Pt reports stiffness, pain when she wakes up in the morning in bilateral hands --Ice PRN, Tylenol PRN, consider wrist braces if needed  Preterm labor symptoms and general obstetric precautions including but not limited to vaginal bleeding, contractions, leaking of fluid and fetal movement were reviewed in detail with the patient. Please refer to After Visit Summary for other counseling recommendations.   Return in about 2 weeks (around 07/03/2021).  No future appointments.   Sharen Counter, CNM

## 2021-06-20 ENCOUNTER — Telehealth: Payer: Self-pay | Admitting: Advanced Practice Midwife

## 2021-06-20 LAB — CBC
Hematocrit: 35.9 % (ref 34.0–46.6)
Hemoglobin: 12.4 g/dL (ref 11.1–15.9)
MCH: 29.7 pg (ref 26.6–33.0)
MCHC: 34.5 g/dL (ref 31.5–35.7)
MCV: 86 fL (ref 79–97)
Platelets: 103 10*3/uL — ABNORMAL LOW (ref 150–450)
RBC: 4.18 x10E6/uL (ref 3.77–5.28)
RDW: 11.8 % (ref 11.7–15.4)
WBC: 8.4 10*3/uL (ref 3.4–10.8)

## 2021-06-20 NOTE — Telephone Encounter (Signed)
Pt with gestational thrombocytpenia, Plts previously 146.  Plts on 6/20 at 35 weeks were 103 and pt reported new onset BLE. BP was 120/85 with no s/sx of PEC. Consult Dr Adrian Blackwater who recommends weekly Plts.  Pt is watching BP at home. Pt next appt is currently scheduled for 2 weeks from now but she should be seen weekly and have the lab with a prenatal visit next week.

## 2021-06-29 ENCOUNTER — Encounter: Payer: BC Managed Care – PPO | Admitting: Advanced Practice Midwife

## 2021-07-04 ENCOUNTER — Inpatient Hospital Stay (HOSPITAL_COMMUNITY)
Admission: AD | Admit: 2021-07-04 | Discharge: 2021-07-08 | DRG: 806 | Disposition: A | Discharge status: Home / Self Care | Payer: BC Managed Care – PPO | Attending: Family Medicine | Admitting: Family Medicine

## 2021-07-04 ENCOUNTER — Encounter (HOSPITAL_COMMUNITY): Payer: Self-pay | Admitting: Family Medicine

## 2021-07-04 ENCOUNTER — Ambulatory Visit (INDEPENDENT_AMBULATORY_CARE_PROVIDER_SITE_OTHER): Payer: BC Managed Care – PPO | Admitting: Advanced Practice Midwife

## 2021-07-04 ENCOUNTER — Other Ambulatory Visit: Payer: Self-pay

## 2021-07-04 VITALS — BP 149/101 | HR 76 | Wt 168.8 lb

## 2021-07-04 DIAGNOSIS — Z3480 Encounter for supervision of other normal pregnancy, unspecified trimester: Secondary | ICD-10-CM

## 2021-07-04 DIAGNOSIS — Z3403 Encounter for supervision of normal first pregnancy, third trimester: Secondary | ICD-10-CM

## 2021-07-04 DIAGNOSIS — O3663X0 Maternal care for excessive fetal growth, third trimester, not applicable or unspecified: Secondary | ICD-10-CM | POA: Diagnosis present

## 2021-07-04 DIAGNOSIS — D696 Thrombocytopenia, unspecified: Secondary | ICD-10-CM

## 2021-07-04 DIAGNOSIS — Z3A37 37 weeks gestation of pregnancy: Secondary | ICD-10-CM | POA: Diagnosis not present

## 2021-07-04 DIAGNOSIS — Z20822 Contact with and (suspected) exposure to covid-19: Secondary | ICD-10-CM | POA: Diagnosis present

## 2021-07-04 DIAGNOSIS — R768 Other specified abnormal immunological findings in serum: Secondary | ICD-10-CM | POA: Diagnosis present

## 2021-07-04 DIAGNOSIS — O1403 Mild to moderate pre-eclampsia, third trimester: Secondary | ICD-10-CM

## 2021-07-04 DIAGNOSIS — R7689 Other specified abnormal immunological findings in serum: Secondary | ICD-10-CM | POA: Diagnosis present

## 2021-07-04 DIAGNOSIS — D6959 Other secondary thrombocytopenia: Secondary | ICD-10-CM | POA: Diagnosis present

## 2021-07-04 DIAGNOSIS — Z2839 Other underimmunization status: Secondary | ICD-10-CM

## 2021-07-04 DIAGNOSIS — O1404 Mild to moderate pre-eclampsia, complicating childbirth: Principal | ICD-10-CM | POA: Diagnosis present

## 2021-07-04 DIAGNOSIS — O9912 Other diseases of the blood and blood-forming organs and certain disorders involving the immune mechanism complicating childbirth: Secondary | ICD-10-CM | POA: Diagnosis present

## 2021-07-04 DIAGNOSIS — O1400 Mild to moderate pre-eclampsia, unspecified trimester: Secondary | ICD-10-CM | POA: Diagnosis present

## 2021-07-04 DIAGNOSIS — O99119 Other diseases of the blood and blood-forming organs and certain disorders involving the immune mechanism complicating pregnancy, unspecified trimester: Secondary | ICD-10-CM

## 2021-07-04 DIAGNOSIS — Z23 Encounter for immunization: Secondary | ICD-10-CM | POA: Diagnosis not present

## 2021-07-04 DIAGNOSIS — O134 Gestational [pregnancy-induced] hypertension without significant proteinuria, complicating childbirth: Secondary | ICD-10-CM | POA: Diagnosis present

## 2021-07-04 DIAGNOSIS — O133 Gestational [pregnancy-induced] hypertension without significant proteinuria, third trimester: Secondary | ICD-10-CM

## 2021-07-04 LAB — TYPE AND SCREEN
ABO/RH(D): O POS
Antibody Screen: NEGATIVE

## 2021-07-04 LAB — RESP PANEL BY RT-PCR (FLU A&B, COVID) ARPGX2
Influenza A by PCR: NEGATIVE
Influenza B by PCR: NEGATIVE
SARS Coronavirus 2 by RT PCR: NEGATIVE

## 2021-07-04 LAB — COMPREHENSIVE METABOLIC PANEL
ALT: 11 U/L (ref 0–44)
AST: 24 U/L (ref 15–41)
Albumin: 3 g/dL — ABNORMAL LOW (ref 3.5–5.0)
Alkaline Phosphatase: 145 U/L — ABNORMAL HIGH (ref 38–126)
Anion gap: 10 (ref 5–15)
BUN: 5 mg/dL — ABNORMAL LOW (ref 6–20)
CO2: 19 mmol/L — ABNORMAL LOW (ref 22–32)
Calcium: 9.5 mg/dL (ref 8.9–10.3)
Chloride: 105 mmol/L (ref 98–111)
Creatinine, Ser: 0.65 mg/dL (ref 0.44–1.00)
GFR, Estimated: >60 mL/min (ref >60)
Glucose, Bld: 75 mg/dL (ref 70–99)
Potassium: 4.2 mmol/L (ref 3.5–5.1)
Sodium: 134 mmol/L — ABNORMAL LOW (ref 135–145)
Total Bilirubin: 0.1 mg/dL — ABNORMAL LOW (ref 0.3–1.2)
Total Protein: 6.9 g/dL (ref 6.5–8.1)

## 2021-07-04 LAB — PROTEIN / CREATININE RATIO, URINE
Creatinine, Urine: 99.95 mg/dL
Protein Creatinine Ratio: 0.57 mg/mg{Cre} — ABNORMAL HIGH (ref 0.00–0.15)
Total Protein, Urine: 57 mg/dL

## 2021-07-04 LAB — CBC
HCT: 36.3 % (ref 36.0–46.0)
Hemoglobin: 12.5 g/dL (ref 12.0–15.0)
MCH: 30.1 pg (ref 26.0–34.0)
MCHC: 34.4 g/dL (ref 30.0–36.0)
MCV: 87.5 fL (ref 80.0–100.0)
Platelets: 127 10*3/uL — ABNORMAL LOW (ref 150–400)
RBC: 4.15 MIL/uL (ref 3.87–5.11)
RDW: 12.4 % (ref 11.5–15.5)
WBC: 9.7 10*3/uL (ref 4.0–10.5)
nRBC: 0 % (ref 0.0–0.2)

## 2021-07-04 LAB — GROUP B STREP BY PCR: Group B strep by PCR: NEGATIVE

## 2021-07-04 MED ORDER — OXYTOCIN BOLUS FROM INFUSION
333.0000 mL | Freq: Once | INTRAVENOUS | Status: AC
  Administered 2021-07-06: 333 mL via INTRAVENOUS

## 2021-07-04 MED ORDER — ACETAMINOPHEN 325 MG PO TABS: 650.0000 mg | ORAL_TABLET | ORAL | Status: DC | PRN

## 2021-07-04 MED ORDER — ONDANSETRON HCL 4 MG/2ML IJ SOLN
4.0000 mg | Freq: Four times a day (QID) | INTRAMUSCULAR | Status: DC | PRN
Start: 2021-07-04 — End: 2021-07-06

## 2021-07-04 MED ORDER — OXYTOCIN-SODIUM CHLORIDE 30-0.9 UT/500ML-% IV SOLN
1.0000 m[IU]/min | INTRAVENOUS | Status: DC
  Administered 2021-07-05: 2 m[IU]/min via INTRAVENOUS
  Filled 2021-07-04: qty 500

## 2021-07-04 MED ORDER — SOD CITRATE-CITRIC ACID 500-334 MG/5ML PO SOLN: 30.0000 mL | ORAL | Status: DC | PRN

## 2021-07-04 MED ORDER — MISOPROSTOL 50MCG HALF TABLET
50.0000 ug | ORAL_TABLET | ORAL | Status: DC | PRN
  Administered 2021-07-04: 50 ug via BUCCAL
  Filled 2021-07-04: qty 1

## 2021-07-04 MED ORDER — LIDOCAINE HCL (PF) 1 % IJ SOLN
30.0000 mL | INTRAMUSCULAR | Status: AC | PRN
  Administered 2021-07-06: 30 mL via SUBCUTANEOUS
  Filled 2021-07-04: qty 30

## 2021-07-04 MED ORDER — OXYCODONE-ACETAMINOPHEN 5-325 MG PO TABS: 1.0000 | ORAL_TABLET | ORAL | Status: DC | PRN

## 2021-07-04 MED ORDER — TERBUTALINE SULFATE 1 MG/ML IJ SOLN: 0.2500 mg | Freq: Once | INTRAMUSCULAR | Status: DC | PRN

## 2021-07-04 MED ORDER — OXYCODONE-ACETAMINOPHEN 5-325 MG PO TABS: 2.0000 | ORAL_TABLET | ORAL | Status: DC | PRN

## 2021-07-04 MED ORDER — LACTATED RINGERS IV SOLN: INTRAVENOUS | Status: DC

## 2021-07-04 MED ORDER — LACTATED RINGERS IV SOLN: 500.0000 mL | INTRAVENOUS | Status: DC | PRN

## 2021-07-04 MED ORDER — OXYTOCIN-SODIUM CHLORIDE 30-0.9 UT/500ML-% IV SOLN: 2.5000 [IU]/h | INTRAVENOUS | Status: DC

## 2021-07-04 NOTE — Progress Notes (Signed)
Labor Progress Note Annette Thompson is a 25 y.o. G1P0 at 37w2dpresented for IOL-mild preE. S: Feels contractions but not intensely   O:  BP (!) 137/92   Pulse 70   Temp 98.4 F (36.9 C) (Oral)   Resp 17   Ht '5\' 1"'  (1.549 m)   Wt 77.1 kg   LMP 10/16/2020   BMI 32.10 kg/m  EFM: baseline 135bpm/mod variability/+accels/no decels Toco: q1-3 min  CVE: Dilation: 5 Effacement (%): 80 Station: -2 Presentation: Vertex Exam by:: Annette Thompson  A&P: 25y.o. G1P0 366w2dresented for IOL-mild preE. #IOL: S/p cyto x1 and FB. Patient has progressed to 5cm without further intervention. Offered AROM however patient declines at this time. Will continue to expectantly manage but with low threshold to augment.   #Pain: PRN #FWB: cat 1 #GBS negative #preE: no history of elevated BP this pregnancy, denies HTN outside of pregnancy. preE labs with p/c 0.57, otherwise normal. Continue to monitor. No severe range BP. #Rubella NI: MMR pp #Gestational thrombocytopenia: platelets on admit 127 #False positive RPR: tpal neg  JuJanet BerlinMD 10:45 PM

## 2021-07-04 NOTE — H&P (Signed)
OBSTETRIC ADMISSION HISTORY AND PHYSICAL  Annette Thompson is a 25 y.o. female G12P0 with IUP at 82w2dby LMP presenting for IOL-newly diagnosed gHTN. She reports +FMs, No LOF, no VB, no blurry vision, headaches or peripheral edema, and RUQ pain.  She plans on breast feeding. She request depo vs nexplanon for birth control. [place] --->  Estimated Date of Delivery: 07/23/21  Sono:    03/02/21'@[redacted]w[redacted]d' , CWD, normal anatomy, breech presentation, posterior placental lie, 374g, 96% EFW   Prenatal History/Complications:  LGA  GBS unknown Rubella NI Gestational thrombocytopenia gHTN False positive RPR  Past Medical History: Past Medical History:  Diagnosis Date   Syncope 01/13/2021    Past Surgical History: History reviewed. No pertinent surgical history.  Obstetrical History: OB History     Gravida  1   Para      Term      Preterm      AB      Living         SAB      IAB      Ectopic      Multiple      Live Births              Social History Social History   Socioeconomic History   Marital status: Significant Other    Spouse name: Not on file   Number of children: Not on file   Years of education: Not on file   Highest education level: Not on file  Occupational History   Not on file  Tobacco Use   Smoking status: Never   Smokeless tobacco: Never  Vaping Use   Vaping Use: Never used  Substance and Sexual Activity   Alcohol use: Not Currently   Drug use: Never   Sexual activity: Yes    Partners: Male    Birth control/protection: None  Other Topics Concern   Not on file  Social History Narrative   Not on file   Social Determinants of Health   Financial Resource Strain: Not on file  Food Insecurity: Not on file  Transportation Needs: Not on file  Physical Activity: Not on file  Stress: Not on file  Social Connections: Not on file    Family History: History reviewed. No pertinent  family history.  Allergies: No Known Allergies  Medications Prior to Admission  Medication Sig Dispense Refill Last Dose   Prenatal Vit-Fe Fumarate-FA (PRENATAL COMPLETE) 14-0.4 MG TABS Take 1 tablet by mouth daily. 60 tablet 3      Review of Systems   All systems reviewed and negative except as stated in HPI  Blood pressure 126/86, pulse 85, temperature 98.7 F (37.1 C), temperature source Oral, resp. rate 18, height '5\' 1"'  (19.242m), weight 77.1 kg, last menstrual period 10/16/2020. General appearance: alert, cooperative, and no distress Lungs: normal respiratory effort Heart: regular rate and rhythm Abdomen: soft, non-tender; gravid Pelvic: as noted below Extremities: Homans sign is negative, no sign of DVT Presentation: cephalic by cervical exam Fetal monitoringBaseline: 140 bpm, Variability: Good {> 6 bpm), Accelerations: Reactive, and Decelerations: Absent Uterine activityFrequency: Every 1-5 minutes Dilation: 1.5 Effacement (%): 50 Station: -2 Exam by:: Dr FSylvester Harder  Prenatal labs: ABO, Rh: --/--/PENDING (07/05 1245) Antibody: PENDING (07/05 1245) Rubella: <0.90 (02/15 1545) RPR: Non Reactive (05/06 0956)  HBsAg: Negative (02/15 1545)  HIV: Non Reactive (05/06 0956)  GBS:   unknown, PCR pending 2 hr  Glucola passed Genetic screening  normal Anatomy US normal  Prenatal Transfer Tool  Maternal Diabetes: No Genetic Screening: Normal Maternal Ultrasounds/Referrals: Normal Fetal Ultrasounds or other Referrals:  None Maternal Substance Abuse:  No Significant Maternal Medications:  None Significant Maternal Lab Results: Other: GBS unk  Results for orders placed or performed during the hospital encounter of 07/04/21 (from the past 24 hour(s))  Resp Panel by RT-PCR (Flu A&B, Covid) Nasopharyngeal Swab   Collection Time: 07/04/21 12:44 PM   Specimen: Nasopharyngeal Swab; Nasopharyngeal(NP) swabs in vial transport medium  Result Value Ref Range   SARS Coronavirus  2 by RT PCR NEGATIVE NEGATIVE   Influenza A by PCR NEGATIVE NEGATIVE   Influenza B by PCR NEGATIVE NEGATIVE  CBC   Collection Time: 07/04/21 12:45 PM  Result Value Ref Range   WBC 9.7 4.0 - 10.5 K/uL   RBC 4.15 3.87 - 5.11 MIL/uL   Hemoglobin 12.5 12.0 - 15.0 g/dL   HCT 36.3 36.0 - 46.0 %   MCV 87.5 80.0 - 100.0 fL   MCH 30.1 26.0 - 34.0 pg   MCHC 34.4 30.0 - 36.0 g/dL   RDW 12.4 11.5 - 15.5 %   Platelets 127 (L) 150 - 400 K/uL   nRBC 0.0 0.0 - 0.2 %  Protein / creatinine ratio, urine   Collection Time: 07/04/21 12:45 PM  Result Value Ref Range   Creatinine, Urine 99.95 mg/dL   Total Protein, Urine 57 mg/dL   Protein Creatinine Ratio 0.57 (H) 0.00 - 0.15 mg/mg[Cre]  Comprehensive metabolic panel   Collection Time: 07/04/21 12:45 PM  Result Value Ref Range   Sodium 134 (L) 135 - 145 mmol/L   Potassium 4.2 3.5 - 5.1 mmol/L   Chloride 105 98 - 111 mmol/L   CO2 19 (L) 22 - 32 mmol/L   Glucose, Bld 75 70 - 99 mg/dL   BUN 5 (L) 6 - 20 mg/dL   Creatinine, Ser 0.65 0.44 - 1.00 mg/dL   Calcium 9.5 8.9 - 10.3 mg/dL   Total Protein 6.9 6.5 - 8.1 g/dL   Albumin 3.0 (L) 3.5 - 5.0 g/dL   AST 24 15 - 41 U/L   ALT 11 0 - 44 U/L   Alkaline Phosphatase 145 (H) 38 - 126 U/L   Total Bilirubin 0.1 (L) 0.3 - 1.2 mg/dL   GFR, Estimated >60 >60 mL/min   Anion gap 10 5 - 15  Type and screen   Collection Time: 07/04/21 12:45 PM  Result Value Ref Range   ABO/RH(D) PENDING    Antibody Screen PENDING    Sample Expiration      07/07/2021,2359 Performed at [facility], [address] [place], [place] [address]     Patient Active Problem List   Diagnosis Date Noted   Gestational hypertension, third trimester 07/04/2021   Biological false positive RPR test 05/08/2021   Rubella non-immune status, antepartum 05/08/2021   Gestational thrombocytopenia ([facility]) 05/08/2021   Encounter for supervision of normal first pregnancy in third trimester 01/27/2021    Assessment/Plan:  Annette Thompson is a 25 y.o. G1P0 at 51w2dhere for IOL-gHTN.  #IOL: Discussed IOL process with patient. FB placed without difficulty. Cyto 50 mcg. #Pain: PRN #FWB: Cat 1 #ID: GBS unk, PCR pending, will not treat given GA and no risk factors #MOF: breast #MOC: deciding between depo and nexplanon, counseled #Circ: no #gHTN: no history of elevated BP this pregnancy, denies HTN outside of pregnancy. Asymptomatic, although she did  have a headache this weekend. preE labs pending. Continue to monitor. #Rubella NI: MMR pp #Gestational thrombocytopenia: platelets on admit 127 #False positive RPR: tpal neg  Arrie Senate, MD  07/04/2021, 2:28 PM

## 2021-07-04 NOTE — Progress Notes (Signed)
149/101 > 154/100  Pt reports higher blood pressures over the weekend: Saturday: 143/97                  127/93  Monday: 132/89                140/93                139/99  Pt denies any headaches or blurred vision.

## 2021-07-04 NOTE — Progress Notes (Signed)
   PRENATAL VISIT NOTE  Subjective:  Annette Thompson is a 25 y.o. G1P0 at [redacted]w[redacted]d being seen today for ongoing prenatal care.  She is currently monitored for the following issues for this high-risk pregnancy and has Encounter for supervision of normal first pregnancy in second trimester; Biological false positive RPR test; Rubella non-immune status, antepartum; and Gestational thrombocytopenia (HCC) on their problem list.  Patient reports no complaints.  Contractions: Not present. Vag. Bleeding: None.  Movement: Present. Denies leaking of fluid.   The following portions of the patient's history were reviewed and updated as appropriate: allergies, current medications, past family history, past medical history, past social history, past surgical history and problem list.   Objective:   Vitals:   07/04/21 0913  BP: (!) 149/101  Pulse: 76  Weight: 168 lb 12.8 oz (76.6 kg)    Fetal Status: Fetal Heart Rate (bpm): 141   Movement: Present     General:  Alert, oriented and cooperative. Patient is in no acute distress.  Skin: Skin is warm and dry. No rash noted.   Cardiovascular: Normal heart rate noted  Respiratory: Normal respiratory effort, no problems with respiration noted  Abdomen: Soft, gravid, appropriate for gestational age.  Pain/Pressure: Absent     Pelvic: Cervical exam deferred        Extremities: Normal range of motion.     Mental Status: Normal mood and affect. Normal behavior. Normal judgment and thought content.   Assessment and Plan:  Pregnancy: G1P0 at [redacted]w[redacted]d 1. Supervision of other normal pregnancy, antepartum --See below, recommend IOL for GHTN and thrombocytopenia --Fetal position vertex, LOA, EFW ~ 7 lbs by [place] today   2. Benign gestational thrombocytopenia, antepartum (HCC) --Plts dropped from 146 to 103 at last visit, weekly labs ordered. Pt unable to have lab drawn last week as power was out in office and her appt rescheduled.   --Given new onset HTN today,  will induce labor for GHTN, labs on admission at hospital.  3. [redacted] weeks gestation of pregnancy   4. Gestational hypertension, third trimester --Consult Dr Adrian Blackwater.  Pt with gestational thrombocytopenia, last platelets 2 weeks ago were 103.  Pt taking BPs at home and were normal on home cuff then elevated 140s/90s over the weekend, first elevated on Saturday, 07/01/21.  --She denies any s/sx of PEC.   --BP elevated in office today, c/w pt home cuff --Per Dr Adrian Blackwater, IOL for [facility], not considered HELLP at this time given earlier thrombocytopenia but will watch.  See platelets on admission.  --Discussed IOL with patient, expected course may be several days, visitor and COVID policies. Pt given opportunity to ask questions.   Term labor symptoms and general obstetric precautions including but not limited to vaginal bleeding, contractions, leaking of fluid and fetal movement were reviewed in detail with the patient. Please refer to After Visit Summary for other counseling recommendations.   No follow-ups on file.  No future appointments.  Sharen Counter, CNM

## 2021-07-04 NOTE — Progress Notes (Signed)
Labor Progress Note Fenna Semel is a 25 y.o. G1P0 at 25w2dpresented for IOL-mild preE. S: Strip reviewed  O:  BP 113/74   Pulse 81   Temp 98.7 F (37.1 C) (Oral)   Resp 17   Ht '5\' 1"'  (1.549 m)   Wt 77.1 kg   LMP 10/16/2020   BMI 32.10 kg/m  EFM: baseline 140bpm/mod variability/+accels/no decels Toco: q1-3 min  CVE: Dilation: 1.5 Effacement (%): 50 Station: -2 Presentation: Vertex Exam by:: Dr FSylvester Harder  A&P: 25y.o. G1P0 39w2dresented for IOL-mild preE. #IOL: S/p cyto x1 and FB. Will start pitocin. AROM when able. #Pain: PRN #FWB: cat 1 #GBS negative #preE: no history of elevated BP this pregnancy, denies HTN outside of pregnancy. preE labs with p/c 0.57, otherwise normal. Continue to monitor. No severe range BP. #Rubella NI: MMR pp #Gestational thrombocytopenia: platelets on admit 127 #False positive RPR: tpal neg  AlArrie SenateMD 6:01 PM

## 2021-07-04 NOTE — Patient Instructions (Signed)
Things to Try After 37 weeks to Encourage Labor/Get Ready for Labor:    Try the [employer] Circuit at www.milescircuit.com daily to improve baby's position and encourage the onset of labor.  Walk a little and rest a little every day.  Change positions often.  Cervical Ripening: May try one or both Red Raspberry Leaf capsules or tea:  two 300mg or 400mg tablets with each meal, 2-3 times a day, or 1-3 cups of tea daily  Potential Side Effects Of Raspberry Leaf:  Most women do not experience any side effects from drinking raspberry leaf tea. However, nausea and loose stools are possible   Evening Primrose Oil capsules: take 1 capsule by mouth and place one capsule in the vagina every night.    Some of the potential side effects:  Upset stomach  Loose stools or diarrhea  Headaches  Nausea  Sex can also help the cervix ripen and encourage labor onset.    Labor Precautions Reasons to come to [facility] at [facility]:  1.  Contractions are  5 minutes apart or less, each last 1 minute, these have been going on for 1-2 hours, and you cannot walk or talk during them 2.  You have a large gush of fluid, or a trickle of fluid that will not stop and you have to wear a pad 3.  You have bleeding that is bright red, heavier than spotting--like menstrual bleeding (spotting can be normal in early labor or after a check of your cervix) 4.  You do not feel the baby moving like he/she normally does  

## 2021-07-04 NOTE — Addendum Note (Signed)
Addended by: Sharen Counter A on: 07/04/2021 10:14 AM   Modules accepted: Orders

## 2021-07-05 ENCOUNTER — Inpatient Hospital Stay (HOSPITAL_COMMUNITY): Payer: BC Managed Care – PPO | Admitting: Anesthesiology

## 2021-07-05 LAB — CBC
HCT: 34.4 % — ABNORMAL LOW (ref 36.0–46.0)
HCT: 35.2 % — ABNORMAL LOW (ref 36.0–46.0)
Hemoglobin: 11.7 g/dL — ABNORMAL LOW (ref 12.0–15.0)
Hemoglobin: 11.7 g/dL — ABNORMAL LOW (ref 12.0–15.0)
MCH: 29.4 pg (ref 26.0–34.0)
MCH: 28.7 pg (ref 26.0–34.0)
MCHC: 34 g/dL (ref 30.0–36.0)
MCHC: 33.2 g/dL (ref 30.0–36.0)
MCV: 86.4 fL (ref 80.0–100.0)
MCV: 86.5 fL (ref 80.0–100.0)
Platelets: 111 10*3/uL — ABNORMAL LOW (ref 150–400)
Platelets: 118 10*3/uL — ABNORMAL LOW (ref 150–400)
RBC: 3.98 MIL/uL (ref 3.87–5.11)
RBC: 4.07 MIL/uL (ref 3.87–5.11)
RDW: 12.4 % (ref 11.5–15.5)
RDW: 12.5 % (ref 11.5–15.5)
WBC: 10.9 10*3/uL — ABNORMAL HIGH (ref 4.0–10.5)
WBC: 13.5 10*3/uL — ABNORMAL HIGH (ref 4.0–10.5)
nRBC: 0 % (ref 0.0–0.2)
nRBC: 0 % (ref 0.0–0.2)

## 2021-07-05 LAB — RPR: RPR Ser Ql: NONREACTIVE

## 2021-07-05 MED ORDER — FENTANYL CITRATE (PF) 100 MCG/2ML IJ SOLN
100.0000 ug | INTRAMUSCULAR | Status: DC | PRN
  Administered 2021-07-05 (×3): 100 ug via INTRAVENOUS
  Filled 2021-07-05 (×2): qty 2

## 2021-07-05 MED ORDER — PHENYLEPHRINE 40 MCG/ML (10ML) SYRINGE FOR IV PUSH (FOR BLOOD PRESSURE SUPPORT): 80.0000 ug | PREFILLED_SYRINGE | INTRAVENOUS | Status: DC | PRN

## 2021-07-05 MED ORDER — EPHEDRINE 5 MG/ML INJ: 10.0000 mg | INTRAVENOUS | Status: DC | PRN

## 2021-07-05 MED ORDER — DIPHENHYDRAMINE HCL 50 MG/ML IJ SOLN: 12.5000 mg | INTRAMUSCULAR | Status: DC | PRN

## 2021-07-05 MED ORDER — TERBUTALINE SULFATE 1 MG/ML IJ SOLN: 0.2500 mg | Freq: Once | INTRAMUSCULAR | Status: DC | PRN

## 2021-07-05 MED ORDER — FENTANYL-BUPIVACAINE-NACL 0.5-0.125-0.9 MG/250ML-% EP SOLN
12.0000 mL/h | EPIDURAL | Status: DC | PRN
  Administered 2021-07-05: 12 mL/h via EPIDURAL
  Filled 2021-07-05: qty 250

## 2021-07-05 MED ORDER — FENTANYL CITRATE (PF) 100 MCG/2ML IJ SOLN
INTRAMUSCULAR | Status: AC
  Filled 2021-07-05: qty 2

## 2021-07-05 MED ORDER — LACTATED RINGERS IV SOLN: 500.0000 mL | Freq: Once | INTRAVENOUS | Status: DC

## 2021-07-05 MED ORDER — OXYTOCIN-SODIUM CHLORIDE 30-0.9 UT/500ML-% IV SOLN
1.0000 m[IU]/min | INTRAVENOUS | Status: DC
  Administered 2021-07-05: 4 m[IU]/min via INTRAVENOUS
  Filled 2021-07-05: qty 500

## 2021-07-05 NOTE — Progress Notes (Addendum)
Annette Thompson is a 25 y.o. G1P0 at [redacted]w[redacted]d by LMP admitted for induction of labor due to gestational hypertension in the setting of known gestational thrombocytopenia.  Preeclampsia dx on admission based on P/C ratio.  Subjective: Pt feeling some intermittent cramping pain. Husband in room for support.  Objective: BP 126/83   Pulse 77   Temp 97.8 F (36.6 C) (Oral)   Resp 17   Ht 5\' 1"  (1.549 m)   Wt 77.1 kg   LMP 10/16/2020   SpO2 99%   BMI 32.10 kg/m  I/O last 3 completed shifts: In: 488.6 [I.V.:488.6] Out: -  No intake/output data recorded.  FHT:  FHR: 145 bpm, variability: moderate,  accelerations:  Present,  decelerations:  Absent UC/IUPC:   inadequate, every 4-5 minutes  SVE:   Dilation: 6 Effacement (%): 80 Station: -1 Exam by:: Anira Senegal MD   Labs: Lab Results  Component Value Date   WBC 13.5 (H) 07/05/2021   HGB 11.7 (L) 07/05/2021   HCT 35.2 (L) 07/05/2021   MCV 86.5 07/05/2021   PLT 118 (L) 07/05/2021   Assessment & Plan:  Labor: Induction started on 7/5. S/p cytotec and FB, Pitocin started at 0345, AROM at 1120 AM. Has been 6cm since AROM at 11AM. Has IUPC in place which has not ever shown adequate contractions despite uptitration of pitocin. Baby has remained Cat I throughout induction course and patient is afebrile.. She is s/p 4 hour pitocin break, will restart and do high dose pit to attempt to achieve adequate contractions. Discussed with patient concern for arrest of dilation and recommendation  for c section if she does not make further change. Patient in agreement with plan. Continue to monitor closely.   Preeclampsia:  labs stable. Repeat platelets at 2100 were 118, stable. BP continues to be mild range to normotensive. Fetal Wellbeing:  Category I Pain Control:  epidural in place  I/D:   GBS neg  9/5 07/05/2021, 10:56 PM

## 2021-07-05 NOTE — Progress Notes (Signed)
Annette Thompson is a 25 y.o. G1P0 at [redacted]w[redacted]d by LMP admitted for induction of labor due to gestational hypertension in the setting of known gestational thrombocytopenia.  Preeclampsia dx on admission based on P/C ratio.  Subjective: Pt feeling some intermittent cramping pain. Husband in room for support.  Objective: BP (!) 127/92   Pulse 75   Temp 98.6 F (37 C) (Oral)   Resp 16   Ht 5\' 1"  (1.549 m)   Wt 77.1 kg   LMP 10/16/2020   BMI 32.10 kg/m  I/O last 3 completed shifts: In: 488.6 [I.V.:488.6] Out: -  No intake/output data recorded.  FHT:  FHR: 135 bpm, variability: moderate,  accelerations:  Present,  decelerations:  Absent UC:   regular, every 2-4 minutes, mild to palpation SVE:   Dilation: 6 Effacement (%): 80 Station: 0 Exam by:: Sheronda Parran,CNM AROM with clear fluid, pt tolerated well  Labs: Lab Results  Component Value Date   WBC 9.7 07/04/2021   HGB 12.5 07/04/2021   HCT 36.3 07/04/2021   MCV 87.5 07/04/2021   PLT 127 (L) 07/04/2021    Assessment / Plan: Induction of labor due to gestational hypertension,  progressing well on pitocin  Labor: Progressing normally Preeclampsia:  labs stable Fetal Wellbeing:  Category I Pain Control:  Labor support without medications I/D:   GBS neg Anticipated MOD:  NSVD  09/04/2021 07/05/2021, 11:43 AM

## 2021-07-05 NOTE — Progress Notes (Signed)
Annette Thompson is a 25 y.o. G1P0 at [redacted]w[redacted]d by LMP admitted for induction of labor due to gestational hypertension in the setting of known gestational thrombocytopenia.  Preeclampsia dx on admission based on P/C ratio.  Subjective: Pt feeling some intermittent cramping pain. Husband in room for support.  Objective: BP (!) 146/93   Pulse 74   Temp 98.6 F (37 C) (Oral)   Resp 20   Ht 5\' 1"  (1.549 m)   Wt 77.1 kg   LMP 10/16/2020   BMI 32.10 kg/m  I/O last 3 completed shifts: In: 488.6 [I.V.:488.6] Out: -  No intake/output data recorded.  FHT:  FHR: 135 bpm, variability: moderate,  accelerations:  Present,  decelerations:  Absent UC:   regular, every 2-4 minutes, mild to palpation SVE:   Dilation: 6 Effacement (%): 90 Station: 0 Exam by:: Dr. [email or web address] and Clementeen Graham   Labs: Lab Results  Component Value Date   WBC 10.9 (H) 07/05/2021   HGB 11.7 (L) 07/05/2021   HCT 34.4 (L) 07/05/2021   MCV 86.4 07/05/2021   PLT 111 (L) 07/05/2021    Assessment / Plan: Induction of labor due to gestational hypertension,  progressing well on pitocin  Labor: no significant change since last exam. Placed IUPC, initially concerned it was not positioned correctly, replaced IUPC. Tracing appeared accurate. Having frequent, low amplitude contractions => poor contraction patter => will 1/2 pit down to 8, assess ctx pattern and power, may slowly titrate up  Preeclampsia:  labs stable Fetal Wellbeing:  Category I Pain Control:  has received 2x doses of fentanyl I/D:   GBS neg Anticipated MOD:  NSVD  09/05/2021 [place] 07/05/2021, 4:14 PM

## 2021-07-05 NOTE — Progress Notes (Signed)
Labor Progress Note Shaneque Merkle is a 25 y.o. G1P0 at 50w2dpresented for IOL-mild preE. S: Ctx spaced out   O:  BP 138/89   Pulse 72   Temp 98.5 F (36.9 C) (Oral)   Resp 18   Ht '5\' 1"'  (1.549 m)   Wt 77.1 kg   LMP 10/16/2020   BMI 32.10 kg/m  EFM: baseline 135bpm/mod variability/+accels/no decels Toco: q3-4 min  CVE: Dilation: 5 Effacement (%): 80 Station: -2 Presentation: Vertex Exam by:: E Chipps RN   A&P: 25y.o. G1P0 345w2dresented for IOL-mild preE. #IOL: S/p cyto x1 and FB. Patient has progressed to 5cm and on prior check declined augmentation. Still unchanged on most recent check, discussed options with patient and will start pitocin at this time.   #Pain: PRN #FWB: cat 1 #GBS negative #preE: no history of elevated BP this pregnancy, denies HTN outside of pregnancy. preE labs with p/c 0.57, otherwise normal. Continue to monitor. No severe range BP. Continues to have mild range pressures.  #Rubella NI: MMR pp #Gestational thrombocytopenia: platelets on admit 127 #False positive RPR: tpal neg  JuJanet BerlinMD 4:59 AM

## 2021-07-06 ENCOUNTER — Encounter (HOSPITAL_COMMUNITY): Payer: Self-pay | Admitting: Family Medicine

## 2021-07-06 DIAGNOSIS — O9912 Other diseases of the blood and blood-forming organs and certain disorders involving the immune mechanism complicating childbirth: Secondary | ICD-10-CM

## 2021-07-06 DIAGNOSIS — Z3A37 37 weeks gestation of pregnancy: Secondary | ICD-10-CM

## 2021-07-06 DIAGNOSIS — O134 Gestational [pregnancy-induced] hypertension without significant proteinuria, complicating childbirth: Secondary | ICD-10-CM

## 2021-07-06 DIAGNOSIS — O1404 Mild to moderate pre-eclampsia, complicating childbirth: Secondary | ICD-10-CM

## 2021-07-06 LAB — CBC
HCT: 34.6 % — ABNORMAL LOW (ref 36.0–46.0)
Hemoglobin: 11.8 g/dL — ABNORMAL LOW (ref 12.0–15.0)
MCH: 29.4 pg (ref 26.0–34.0)
MCHC: 34.1 g/dL (ref 30.0–36.0)
MCV: 86.1 fL (ref 80.0–100.0)
Platelets: 103 10*3/uL — ABNORMAL LOW (ref 150–400)
RBC: 4.02 MIL/uL (ref 3.87–5.11)
RDW: 12.6 % (ref 11.5–15.5)
WBC: 17 10*3/uL — ABNORMAL HIGH (ref 4.0–10.5)
nRBC: 0 % (ref 0.0–0.2)

## 2021-07-06 MED ORDER — EPHEDRINE 5 MG/ML INJ: 10.0000 mg | INTRAVENOUS | Status: DC | PRN

## 2021-07-06 MED ORDER — MEDROXYPROGESTERONE ACETATE 150 MG/ML IM SUSP: 150.0000 mg | INTRAMUSCULAR | Status: DC | PRN

## 2021-07-06 MED ORDER — LACTATED RINGERS IV SOLN: 500.0000 mL | Freq: Once | INTRAVENOUS | Status: DC

## 2021-07-06 MED ORDER — DIPHENHYDRAMINE HCL 25 MG PO CAPS: 25.0000 mg | ORAL_CAPSULE | Freq: Four times a day (QID) | ORAL | Status: DC | PRN

## 2021-07-06 MED ORDER — WITCH HAZEL-GLYCERIN EX PADS: 1.0000 "application " | MEDICATED_PAD | CUTANEOUS | Status: DC | PRN

## 2021-07-06 MED ORDER — METHYLERGONOVINE MALEATE 0.2 MG/ML IJ SOLN
INTRAMUSCULAR | Status: AC
  Administered 2021-07-06: 0.2 mg via INTRAMUSCULAR
  Filled 2021-07-06: qty 1

## 2021-07-06 MED ORDER — BENZOCAINE-MENTHOL 20-0.5 % EX AERO
1.0000 "application " | INHALATION_SPRAY | CUTANEOUS | Status: DC | PRN
  Administered 2021-07-07: 1 via TOPICAL
  Filled 2021-07-06: qty 56

## 2021-07-06 MED ORDER — METHYLERGONOVINE MALEATE 0.2 MG/ML IJ SOLN: 0.2000 mg | Freq: Once | INTRAMUSCULAR | Status: AC

## 2021-07-06 MED ORDER — NIFEDIPINE ER OSMOTIC RELEASE 30 MG PO TB24
30.0000 mg | ORAL_TABLET | Freq: Every day | ORAL | Status: DC
Start: 2021-07-06 — End: 2021-07-08
  Administered 2021-07-06 – 2021-07-08 (×3): 30 mg via ORAL
  Filled 2021-07-06 (×3): qty 1

## 2021-07-06 MED ORDER — ONDANSETRON HCL 4 MG/2ML IJ SOLN: 4.0000 mg | INTRAMUSCULAR | Status: DC | PRN

## 2021-07-06 MED ORDER — IBUPROFEN 600 MG PO TABS
600.0000 mg | ORAL_TABLET | Freq: Four times a day (QID) | ORAL | Status: DC
  Administered 2021-07-06 – 2021-07-08 (×9): 600 mg via ORAL
  Filled 2021-07-06 (×8): qty 1

## 2021-07-06 MED ORDER — ONDANSETRON HCL 4 MG PO TABS: 4.0000 mg | ORAL_TABLET | ORAL | Status: DC | PRN

## 2021-07-06 MED ORDER — ACETAMINOPHEN 325 MG PO TABS: 650.0000 mg | ORAL_TABLET | ORAL | Status: DC | PRN

## 2021-07-06 MED ORDER — COCONUT OIL OIL: 1.0000 "application " | TOPICAL_OIL | Status: DC | PRN

## 2021-07-06 MED ORDER — DIBUCAINE (PERIANAL) 1 % EX OINT: 1.0000 "application " | TOPICAL_OINTMENT | CUTANEOUS | Status: DC | PRN

## 2021-07-06 MED ORDER — METHYLERGONOVINE MALEATE 0.2 MG/ML IJ SOLN
INTRAMUSCULAR | Status: AC
  Filled 2021-07-06: qty 1

## 2021-07-06 MED ORDER — PRENATAL MULTIVITAMIN CH
1.0000 | ORAL_TABLET | Freq: Every day | ORAL | Status: DC
  Administered 2021-07-07 – 2021-07-08 (×2): 1 via ORAL
  Filled 2021-07-06 (×2): qty 1

## 2021-07-06 MED ORDER — DIPHENHYDRAMINE HCL 50 MG/ML IJ SOLN
12.5000 mg | INTRAMUSCULAR | Status: DC | PRN
Start: 2021-07-06 — End: 2021-07-06

## 2021-07-06 MED ORDER — PHENYLEPHRINE 40 MCG/ML (10ML) SYRINGE FOR IV PUSH (FOR BLOOD PRESSURE SUPPORT): 80.0000 ug | PREFILLED_SYRINGE | INTRAVENOUS | Status: DC | PRN

## 2021-07-06 MED ORDER — MEASLES, MUMPS & RUBELLA VAC IJ SOLR
0.5000 mL | Freq: Once | INTRAMUSCULAR | Status: AC
  Administered 2021-07-08: 0.5 mL via SUBCUTANEOUS
  Filled 2021-07-06: qty 0.5

## 2021-07-06 MED ORDER — LIDOCAINE HCL (PF) 1 % IJ SOLN
INTRAMUSCULAR | Status: DC | PRN
  Administered 2021-07-05: 6 mL via EPIDURAL

## 2021-07-06 MED ORDER — SIMETHICONE 80 MG PO CHEW: 80.0000 mg | CHEWABLE_TABLET | ORAL | Status: DC | PRN

## 2021-07-06 MED ORDER — FENTANYL-BUPIVACAINE-NACL 0.5-0.125-0.9 MG/250ML-% EP SOLN: 12.0000 mL/h | EPIDURAL | Status: DC | PRN

## 2021-07-06 MED ORDER — DOCUSATE SODIUM 100 MG PO CAPS
100.0000 mg | ORAL_CAPSULE | Freq: Two times a day (BID) | ORAL | Status: DC
  Administered 2021-07-06 – 2021-07-08 (×4): 100 mg via ORAL
  Filled 2021-07-06 (×4): qty 1

## 2021-07-06 MED ORDER — TETANUS-DIPHTH-ACELL PERTUSSIS 5-2.5-18.5 LF-MCG/0.5 IM SUSY
0.5000 mL | PREFILLED_SYRINGE | Freq: Once | INTRAMUSCULAR | Status: AC
  Administered 2021-07-08: 0.5 mL via INTRAMUSCULAR
  Filled 2021-07-06: qty 0.5

## 2021-07-06 MED ORDER — SENNOSIDES-DOCUSATE SODIUM 8.6-50 MG PO TABS
2.0000 | ORAL_TABLET | ORAL | Status: DC
  Administered 2021-07-06 – 2021-07-08 (×3): 2 via ORAL
  Filled 2021-07-06 (×3): qty 2

## 2021-07-06 NOTE — Discharge Summary (Addendum)
Postpartum Discharge Summary  Date of Service updated: 07/08/2021     Patient Name: Annette Thompson DOB: 03-07-1996 MRN: [id number]  Date of admission: 07/04/2021 Delivery date:07/06/2021  Delivering provider: Janet Berlin  Date of discharge: 07/08/2021  Admitting diagnosis: Gestational hypertension, third trimester [O13.3] Intrauterine pregnancy: [redacted]w[redacted]d    Secondary diagnosis:  Principal Problem:   Mild preeclampsia Active Problems:   Encounter for supervision of normal first pregnancy in third trimester   Biological false positive RPR test   Rubella non-immune status, antepartum   Gestational thrombocytopenia ([place]  Additional problems: All stated above    Discharge diagnosis: Term Pregnancy Delivered and Preeclampsia (mild)                                              Post partum procedures: N/A Augmentation: AROM, Pitocin, Cytotec, and IP Foley Complications: None  Hospital course: Induction of Labor With Vaginal Delivery   25y.o. yo G1P0 at 331w4das admitted to the hospital 07/04/2021 for induction of labor.  Indication for induction: Preeclampsia.  Patient had an uncomplicated labor course as follows: Membrane Rupture Time/Date: 11:00 AM ,07/05/2021   Delivery Method:Vaginal, Spontaneous  Episiotomy: None  Lacerations:  1st degree  Details of delivery can be found in separate delivery note.  Patient had a routine postpartum course. Patient is discharged home 07/08/21.  Newborn Data: Birth date:07/06/2021  Birth time:4:00 AM  Gender:Female  Living status:Living  Apgars:9 ,9  Weight:3031 g   Magnesium Sulfate received: No BMZ received: No Rhophylac:N/A MMR:N/A T-DaP:Given prenatally Flu: No Transfusion:No  Physical exam  Vitals:   07/06/21 2130 07/07/21 0441 07/07/21 1500 07/08/21 0556  BP: 132/90 (!) 130/92 127/90 130/86  Pulse: 87 70 82 88  Resp: '16 18 18 18  ' Temp: 98.1 F (36.7 C) 98.2 F (36.8 C) 98.2 F (36.8 C) 98.3 F (36.8 C)  TempSrc: Oral  Oral Axillary Oral  SpO2: 99% 99%    Weight:      Height:       General: alert, cooperative, and no distress Lochia: appropriate Uterine Fundus: firm DVT Evaluation: No evidence of DVT seen on physical exam. Negative Homan's sign. Labs: Lab Results  Component Value Date   WBC 17.0 (H) 07/06/2021   HGB 11.8 (L) 07/06/2021   HCT 34.6 (L) 07/06/2021   MCV 86.1 07/06/2021   PLT 103 (L) 07/06/2021   CMP Latest Ref Rng & Units 07/04/2021  Glucose 70 - 99 mg/dL 75  BUN 6 - 20 mg/dL 5(L)  Creatinine 0.44 - 1.00 mg/dL 0.65  Sodium 135 - 145 mmol/L 134(L)  Potassium 3.5 - 5.1 mmol/L 4.2  Chloride 98 - 111 mmol/L 105  CO2 22 - 32 mmol/L 19(L)  Calcium 8.9 - 10.3 mg/dL 9.5  Total Protein 6.5 - 8.1 g/dL 6.9  Total Bilirubin 0.3 - 1.2 mg/dL 0.1(L)  Alkaline Phos 38 - 126 U/L 145(H)  AST 15 - 41 U/L 24  ALT 0 - 44 U/L 11   Edinburgh Score: Edinburgh Postnatal Depression Scale Screening Tool 07/07/2021  I have been able to laugh and see the funny side of things. (No Data)     After visit meds:  Allergies as of 07/08/2021   No Known Allergies      Medication List     TAKE these medications    acetaminophen 325 MG tablet  Commonly known as: Tylenol Take 3 tablets (975 mg total) by mouth every 6 (six) hours as needed.   ibuprofen 600 MG tablet Commonly known as: ADVIL Take 1 tablet (600 mg total) by mouth every 6 (six) hours.   NIFEdipine 30 MG 24 hr tablet Commonly known as: ADALAT CC Take 1 tablet (30 mg total) by mouth daily.   Prenatal Complete 14-0.4 MG Tabs Take 1 tablet by mouth daily.         Discharge home in stable condition Infant Feeding: Breast Infant Disposition:home with mother Discharge instruction: per After Visit Summary and Postpartum booklet. Activity: Advance as tolerated. Pelvic rest for 6 weeks.  Diet: routine diet Future Appointments: Future Appointments  Date Time Provider [place]  07/13/2021  1:40 PM [place] None   08/07/2021  2:10 PM [place], Kathie Dike, CNM [facility] None   Follow up Visit:   Please schedule this patient for a In person postpartum visit in 4 weeks with the following provider: APP. Additional Postpartum F/U:BP check 1 week , continue procardia 64m daily  High risk pregnancy complicated by:  PreE w/o SF, gestational thrombocytopenia Delivery mode:  Vaginal, Spontaneous  Anticipated Birth Control:  Nexplanon OP    07/08/2021 MMaury Dus MD  Attestation:  I confirm that I have verified the information documented in the resident's note and that I have also personally reperformed the physical exam and all medical decision making activities.   Patient was seen and examined by me also Agree with note Vitals stable Labs stable Fundus firm, lochia within normal limits Perineum healing Ext WNL  Ready for discharge  WSeabron Spates CNM

## 2021-07-06 NOTE — Lactation Note (Signed)
This note was copied from a baby's chart. Lactation Consultation Note  Patient Name: Annette Thompson Today's Date: 07/06/2021 Reason for consult: Initial assessment;Early term 37-38.6wks Age:26 hours  Mom states infant has some difficulty latching.  She is able to hand express.  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  Infant cueing.  Placed STS to latch in laid back position but Infant sucked twice then would slip off and suck his own tongue.  Suck training shown to parents using mom's EBM.  Infant was then placed in football hold which mom feels more comfortable in now.   Mom prepumped with manual to draw nipple out for easier latch.  Infant continued to suck a few times then close his mouth, slipping off the nipple.    LC encouraged STS and hand expression and to feed infant back with spoon feeding or finger feeding.   Dad asked for assistance with changing wet diaper.  Family will call out with questions.  BF basics reviewed. 8-12 feeds in 24, hand expression prior to latching, and feeding cures discussed.  Mom is aware of OP services and BFSG and phone line.   Maternal Data Has patient been taught Hand Expression?: Yes Does the patient have breastfeeding experience prior to this delivery?: No  Feeding Mother's Current Feeding Choice: Breast Milk  LATCH Score Latch: Too sleepy or reluctant, no latch achieved, no sucking elicited.  Audible Swallowing: None  Type of Nipple: Everted at rest and after stimulation  Comfort (Breast/Nipple): Soft / non-tender  Hold (Positioning): Assistance needed to correctly position infant at breast and maintain latch.  LATCH Score: 5   Lactation Tools Discussed/Used    Interventions Interventions: Breast feeding basics reviewed;Assisted with latch;Skin to skin;Breast massage;Support pillows;Hand express;Expressed milk;Hand pump  Discharge Pump: Manual WIC Program:  No  Consult Status Consult Status: Follow-up Date: 07/07/21 Follow-up type: In-patient    Maryruth Hancock [facility] 07/06/2021, 12:41 PM

## 2021-07-06 NOTE — Anesthesia Postprocedure Evaluation (Signed)
Anesthesia Post Note  Patient: Annette Thompson  Procedure(s) Performed: AN AD HOC LABOR EPIDURAL     Patient location during evaluation: Mother Baby Anesthesia Type: Epidural Level of consciousness: awake, awake and alert and oriented Pain management: pain level controlled Vital Signs Assessment: post-procedure vital signs reviewed and stable Respiratory status: spontaneous breathing, respiratory function stable and nonlabored ventilation Cardiovascular status: blood pressure returned to baseline Postop Assessment: no headache, no backache, no apparent nausea or vomiting, adequate PO intake, able to ambulate and epidural receding Anesthetic complications: no   No notable events documented.  Last Vitals:  Vitals:   07/06/21 1330 07/06/21 1730  BP: (!) 135/96 (!) 128/98  Pulse: 90 92  Resp:  16  Temp:  36.8 C  SpO2:  99%    Last Pain:  Vitals:   07/06/21 1730  TempSrc: Oral  PainSc:    Pain Goal:                   Jennelle Human

## 2021-07-06 NOTE — Anesthesia Procedure Notes (Signed)
Epidural Patient location during procedure: OB Start time: 07/05/2021 10:07 PM End time: 07/05/2021 10:10 PM  Preanesthetic Checklist Completed: patient identified, IV checked, site marked, risks and benefits discussed, surgical consent, monitors and equipment checked, pre-op evaluation and timeout performed  Epidural Patient position: sitting Prep: DuraPrep and site prepped and draped Patient monitoring: continuous pulse ox and blood pressure Approach: midline Location: L4-L5 Injection technique: LOR air  Needle:  Needle type: Tuohy  Needle gauge: 17 G Needle length: 9 cm and 9 Needle insertion depth: 6 cm Catheter type: closed end flexible Catheter size: 19 Gauge Catheter at skin depth: 12 cm Test dose: negative  Assessment Events: blood not aspirated, injection not painful, no injection resistance, no paresthesia and negative IV test

## 2021-07-06 NOTE — Anesthesia Preprocedure Evaluation (Signed)
Anesthesia Evaluation  Patient identified by MRN, date of birth, ID band Patient awake    Reviewed: Allergy & Precautions, H&P , NPO status , Patient's Chart, lab work & pertinent test results, reviewed documented beta blocker date and time   Airway Mallampati: II  TM Distance: >3 FB Neck ROM: full    Dental no notable dental hx. (+) Teeth Intact, Dental Advisory Given   Pulmonary neg pulmonary ROS,    Pulmonary exam normal breath sounds clear to auscultation       Cardiovascular hypertension, Pt. on medications Normal cardiovascular exam Rhythm:regular Rate:Normal     Neuro/Psych negative neurological ROS  negative psych ROS   GI/Hepatic negative GI ROS, Neg liver ROS,   Endo/Other  negative endocrine ROS  Renal/GU negative Renal ROS  negative genitourinary   Musculoskeletal   Abdominal   Peds  Hematology negative hematology ROS (+)   Anesthesia Other Findings   Reproductive/Obstetrics (+) Pregnancy                             Anesthesia Physical Anesthesia Plan  ASA: 3  Anesthesia Plan: Epidural   Post-op Pain Management:    Induction:   PONV Risk Score and Plan: Treatment may vary due to age or medical condition  Airway Management Planned: Natural Airway  Additional Equipment: None  Intra-op Plan:   Post-operative Plan:   Informed Consent: I have reviewed the patients History and Physical, chart, labs and discussed the procedure including the risks, benefits and alternatives for the proposed anesthesia with the patient or authorized representative who has indicated his/her understanding and acceptance.       Plan Discussed with: Anesthesiologist  Anesthesia Plan Comments:         Anesthesia Quick Evaluation

## 2021-07-07 ENCOUNTER — Other Ambulatory Visit (HOSPITAL_COMMUNITY): Payer: Self-pay

## 2021-07-07 MED ORDER — NIFEDIPINE ER 30 MG PO TB24
30.0000 mg | ORAL_TABLET | Freq: Every day | ORAL | 1 refills | Status: AC
  Filled 2021-07-07: qty 30, 30d supply, fill #0

## 2021-07-07 NOTE — Progress Notes (Addendum)
POSTPARTUM PROGRESS NOTE  Subjective: Annette Thompson is a 25 y.o. G1P1001 PPD#1 SVD @ [redacted]w[redacted]d  She reports she is doing well. No acute events overnight. She denies any problems with ambulating, voiding or po intake. Denies nausea or vomiting. She has passed flatus. Pain is well controlled.  Lochia is appropriate.  Objective: Blood pressure (!) 130/92, pulse 70, temperature 98.2 F (36.8 C), temperature source Oral, resp. rate 18, height _0  (1.549 m), weight 77.1 kg, last menstrual period 10/16/2020, SpO2 99 %, unknown if currently breastfeeding.  Physical Exam:  General: alert, cooperative and no distress Chest: no respiratory distress Abdomen: soft, non-tender  Uterine Fundus: firm and at level of umbilicus Extremities: No calf swelling or tenderness  No edema  Recent Labs    07/05/21 2110 07/06/21 0614  HGB 11.7* 11.8*  HCT 35.2* 34.6*    Assessment/Plan: Annette Thompson a 25y.o. G1P1001 PPD#1 SVD @ 341w4d Routine Postpartum Care: Doing well, pain well-controlled.  -- Continue routine care, lactation support  -- Contraception: Depo vs. OP Nexplanon -- Feeding: Breast  Dispo: Plan for discharge PPD#2.  MaErskine EmeryMDDriscollor [facility] 07/07/2021 8:17 AM I personally saw and evaluated the patient, performing the key elements of the service. I developed and verified the management plan that is described in the resident's/student's note, and I agree with the content with my edits above. VSS, HRR&R, Resp unlabored, Legs neg.  FrNigel BertholdCNM 07/07/2021 8:18 AM

## 2021-07-07 NOTE — Lactation Note (Signed)
This note was copied from a baby's chart. Lactation Consultation Note  Patient Name: Annette Thompson PTWSF'K Date: 07/07/2021 Reason for consult: Follow-up assessment;Mother's request;Difficult latch;Primapara;1st time breastfeeding;Early term 37-38.6wks;Infant weight loss Age:26 hours  LC called for latch assistance. Mom complaining of pain with the latch at the nipple. Some bruising noted around the nipple area. Mom to use eBM for healing. RN to provide coconut oil.   Infant latched in cross cradle position on his stomach with a laid back position. Mom stated pain resolved with change in position. Infant 4 stool and 2 urine and 20 minute feeding.   Maternal Data Has patient been taught Hand Expression?: Yes  Feeding Mother's Current Feeding Choice: Breast Milk  LATCH Score Latch: Repeated attempts needed to sustain latch, nipple held in mouth throughout feeding, stimulation needed to elicit sucking reflex.  Audible Swallowing: Spontaneous and intermittent  Type of Nipple: Everted at rest and after stimulation  Comfort (Breast/Nipple): Filling, red/small blisters or bruises, mild/mod discomfort  Hold (Positioning): Assistance needed to correctly position infant at breast and maintain latch.  LATCH Score: 7   Lactation Tools Discussed/Used Tools: Pump;Flanges Flange Size: 24 Breast pump type: Manual Pump Education: Setup, frequency, and cleaning;Milk Storage Reason for Pumping: increase stimulation Pumping frequency: pre pumping 5-10 min before latching  Interventions Interventions: Breast feeding basics reviewed;Support pillows;Education;Assisted with latch;Position options;Skin to skin;Expressed milk;Breast massage;Hand express;Breast compression;Adjust position;Hand pump  Discharge    Consult Status Consult Status: Follow-up Date: 07/08/21 Follow-up type: In-patient    Adriel Desrosier  [place] 07/07/2021, 11:01 PM

## 2021-07-08 ENCOUNTER — Ambulatory Visit: Payer: Self-pay

## 2021-07-08 MED ORDER — IBUPROFEN 600 MG PO TABS
600.0000 mg | ORAL_TABLET | Freq: Four times a day (QID) | ORAL | 0 refills | Status: AC
  Filled 2021-07-08: qty 30, 8d supply, fill #0

## 2021-07-08 MED ORDER — ACETAMINOPHEN 325 MG PO TABS: 1000.0000 mg | ORAL_TABLET | Freq: Four times a day (QID) | ORAL | Status: AC | PRN

## 2021-07-08 NOTE — Lactation Note (Signed)
This note was copied from a baby's chart. Lactation Consultation Note  Patient Name: Boy Eryka Dolinger Today's Date: 07/08/2021   Age:26 hours  In to see P1 mom, states baby falls asleep when up at breast, has had 2 feedings x74mins but majority of feedings today have been 5-28mins, denies seeing milk when using hand pump, reports supplementing with formula as a result. Reports last feeding was ~5hrs prior to [facility] entering, mom holding baby skin to skin to help wake.  LC assisted with latching baby to left breast football hold. Baby latched to breast, audible swallows noted, moderate angle. Slanted nipple noted on release. Fitted mom with 71mm nipple shield, baby latched and sucked but appears to feed better without shield. Reinforced cue based feedings, expect 8-12 feedings a day, monitor wet and stool diapers to assess milk intake, ok to pump with hand pump or DEBP after feedings and offer EBM to baby. Mom has hand pump at bedside. Left the room with baby still latched to left breast ~16min mark. Erenest Rasher, RN, IBCLC Maternal Data    Feeding    LATCH Score=8                      Charlynn Court 07/08/2021, 3:54 PM

## 2021-07-10 ENCOUNTER — Other Ambulatory Visit (HOSPITAL_COMMUNITY): Payer: Self-pay

## 2021-07-13 ENCOUNTER — Other Ambulatory Visit: Payer: Self-pay

## 2021-07-13 ENCOUNTER — Ambulatory Visit (INDEPENDENT_AMBULATORY_CARE_PROVIDER_SITE_OTHER): Payer: BC Managed Care – PPO

## 2021-07-13 DIAGNOSIS — Z0130 Encounter for examination of blood pressure without abnormal findings: Secondary | ICD-10-CM | POA: Diagnosis not present

## 2021-07-13 NOTE — Progress Notes (Signed)
Subjective:  Annette Thompson is a 25 y.o. female here for BP check.   Hypertension ROS: taking medications as instructed, no medication side effects noted, no TIA's, no chest pain on exertion, no dyspnea on exertion, and no swelling of ankles.    Objective:  BP 133/86   Pulse 96   Ht 5\' 1"  (1.549 m)   Wt 155 lb (70.3 kg)   LMP 10/16/2020   Breastfeeding Yes   BMI 29.29 kg/m   Appearance alert, well appearing, and in no distress. General exam BP noted to be well controlled today in office.    Assessment:   Blood Pressure well controlled.   Plan:  Current treatment plan is effective, no change in therapy. RTO in 1 week for BP check.

## 2021-07-18 ENCOUNTER — Telehealth (HOSPITAL_COMMUNITY): Payer: Self-pay | Admitting: *Deleted

## 2021-07-18 NOTE — Telephone Encounter (Signed)
Left message to return nurse call.  Duffy Rhody, RN 07-18-2021 at 1:32pm

## 2021-07-20 ENCOUNTER — Ambulatory Visit (INDEPENDENT_AMBULATORY_CARE_PROVIDER_SITE_OTHER): Payer: BC Managed Care – PPO

## 2021-07-20 ENCOUNTER — Other Ambulatory Visit: Payer: Self-pay

## 2021-07-20 DIAGNOSIS — Z0130 Encounter for examination of blood pressure without abnormal findings: Secondary | ICD-10-CM

## 2021-07-20 NOTE — Progress Notes (Signed)
Subjective:  Annette Thompson is a 25 y.o. female here for BP check 2 weeks PP.   Hypertension ROS: taking medications as instructed, no medication side effects noted, no TIA's, no chest pain on exertion, no dyspnea on exertion, and no swelling of ankles.    Objective:  BP 117/74   Pulse 99   Wt 150 lb (68 kg)   LMP 10/16/2020   Breastfeeding Yes   BMI 28.34 kg/m   Appearance alert, well appearing, and in no distress. General exam BP noted to be well controlled today in office.    Assessment:   Blood Pressure well controlled.   Plan:  Current treatment plan is effective, no change in therapy. Keep PP appt.

## 2021-08-07 ENCOUNTER — Other Ambulatory Visit: Payer: Self-pay

## 2021-08-07 ENCOUNTER — Ambulatory Visit (INDEPENDENT_AMBULATORY_CARE_PROVIDER_SITE_OTHER): Payer: BC Managed Care – PPO | Admitting: Advanced Practice Midwife

## 2021-08-07 VITALS — BP 112/73 | HR 71 | Ht 61.0 in | Wt 148.8 lb

## 2021-08-07 DIAGNOSIS — Z8759 Personal history of other complications of pregnancy, childbirth and the puerperium: Secondary | ICD-10-CM

## 2021-08-07 DIAGNOSIS — O1403 Mild to moderate pre-eclampsia, third trimester: Secondary | ICD-10-CM

## 2021-08-07 DIAGNOSIS — O99119 Other diseases of the blood and blood-forming organs and certain disorders involving the immune mechanism complicating pregnancy, unspecified trimester: Secondary | ICD-10-CM

## 2021-08-07 DIAGNOSIS — D696 Thrombocytopenia, unspecified: Secondary | ICD-10-CM

## 2021-08-07 NOTE — Progress Notes (Signed)
Post Partum Visit Note  Annette Thompson is a 25 y.o. G52P1001 female who presents for a postpartum visit. She is 4 weeks postpartum following a normal spontaneous vaginal delivery.  I have fully reviewed the prenatal and intrapartum course. The delivery was at 37 gestational weeks.  Anesthesia: epidural. Postpartum course has been uncomplicated. Baby is doing well. Baby is feeding by both breast and bottle - Similac Advance. Bleeding staining only. Bowel function is abnormal: constipation . Bladder function is normal. Patient is not sexually active. Contraception method is none. Postpartum depression screening: negative.   The pregnancy intention screening data noted above was reviewed. Potential methods of contraception were discussed. The patient elected to proceed with No data recorded.    Health Maintenance Due  Topic Date Due   HPV VACCINES (1 - 2-dose series) Never done   INFLUENZA VACCINE  07/31/2021    The following portions of the patient's history were reviewed and updated as appropriate: allergies, current medications, past family history, past medical history, past social history, past surgical history, and problem list.  Review of Systems Pertinent items noted in HPI and remainder of comprehensive ROS otherwise negative.  Objective:  BP 112/73   Pulse 71   Ht 5\' 1"  (1.549 m)   Wt 148 lb 12.8 oz (67.5 kg)   LMP 10/16/2020   BMI 28.12 kg/m    VS reviewed, nursing note reviewed,  Constitutional: well developed, well nourished, no distress HEENT: normocephalic CV: normal rate Pulm/chest wall: normal effort Abdomen: soft Neuro: alert and oriented x 3 Skin: warm, dry Psych: affect normal   Assessment:   1. Benign gestational thrombocytopenia, antepartum (HCC) --Plts stable in pregnancy - CBC  2. Mild pre-eclampsia in third trimester --On Procardia daily, BP grossly normal today and at previous BP check --D/C Procardia now, take BP at home x 3-4 days, notify  office if elevated --No s/sx of PEC today, pt doing well  3. Postpartum care following vaginal delivery   Normal postpartum exam.   Plan:   Essential components of care per ACOG recommendations:  1.  Mood and well being: Patient with negative depression screening today. Reviewed local resources for support.  - Patient tobacco use? No.   - hx of drug use? No.    2. Infant care and feeding:  -Patient currently breastmilk feeding? Yes. Discussed returning to work and pumping. Reviewed importance of draining breast regularly to support lactation.  -Social determinants of health (SDOH) reviewed in EPIC. No concerns  3. Sexuality, contraception and birth spacing - Patient does not want a pregnancy in the next year.   - Reviewed forms of contraception in tiered fashion. Patient desired  Nexplanon  today.  Desires to schedule at 6 weeks PP. - Discussed birth spacing of 18 months  4. Sleep and fatigue -Encouraged family/partner/community support of 4 hrs of uninterrupted sleep to help with mood and fatigue  5. Physical Recovery  - Discussed patients delivery and complications. She describes her labor as good. - Patient had a Vaginal, no problems at delivery. Patient had a 1st degree laceration. Perineal healing reviewed. Patient expressed understanding - Patient has urinary incontinence? No. - Patient is safe to resume physical and sexual activity  6.  Health Maintenance - HM due items addressed Yes - Last pap smear  Diagnosis  Date Value Ref Range Status  02/14/2021   Final   - Negative for intraepithelial lesion or malignancy (NILM)   Pap smear not done at today's visit.  -  Breast Cancer screening indicated? No.   7. Chronic Disease/Pregnancy Condition follow up: Hypertension --D/C Procardia, take BP at home x 3-4 days, notify office if elevated  - PCP follow up  Sharen Counter, CNM [facility] [employer]

## 2021-08-07 NOTE — Progress Notes (Signed)
Postpartum 4 wks  Wants to discuss contraception. Considering depo then nexplanon.   Only has one Nifedipine left. BP 112/73 today.

## 2021-08-08 LAB — CBC
Hematocrit: 37 % (ref 34.0–46.6)
Hemoglobin: 12 g/dL (ref 11.1–15.9)
MCH: 28.2 pg (ref 26.6–33.0)
MCHC: 32.4 g/dL (ref 31.5–35.7)
MCV: 87 fL (ref 79–97)
Platelets: 168 10*3/uL (ref 150–450)
RBC: 4.25 x10E6/uL (ref 3.77–5.28)
RDW: 12.1 % (ref 11.7–15.4)
WBC: 6.9 10*3/uL (ref 3.4–10.8)

## 2021-08-28 ENCOUNTER — Encounter: Payer: Self-pay | Admitting: Advanced Practice Midwife

## 2021-08-28 ENCOUNTER — Other Ambulatory Visit: Payer: Self-pay

## 2021-08-28 ENCOUNTER — Ambulatory Visit (INDEPENDENT_AMBULATORY_CARE_PROVIDER_SITE_OTHER): Payer: BC Managed Care – PPO | Admitting: Advanced Practice Midwife

## 2021-08-28 VITALS — BP 125/81 | HR 82 | Ht 61.0 in | Wt 154.0 lb

## 2021-08-28 DIAGNOSIS — Z975 Presence of (intrauterine) contraceptive device: Secondary | ICD-10-CM | POA: Insufficient documentation

## 2021-08-28 DIAGNOSIS — Z01812 Encounter for preprocedural laboratory examination: Secondary | ICD-10-CM

## 2021-08-28 DIAGNOSIS — Z30017 Encounter for initial prescription of implantable subdermal contraceptive: Secondary | ICD-10-CM

## 2021-08-28 LAB — POCT URINE PREGNANCY: Preg Test, Ur: NEGATIVE

## 2021-08-28 MED ORDER — ETONOGESTREL 68 MG ~~LOC~~ IMPL
68.0000 mg | DRUG_IMPLANT | Freq: Once | SUBCUTANEOUS | Status: AC
  Administered 2021-08-28: 68 mg via SUBCUTANEOUS

## 2021-08-28 NOTE — Progress Notes (Signed)
   GYNECOLOGY PROGRESS NOTE  History:  25 y.o. G1P1001 presents to [facility] office today for Nexplanon insertion.  She reports unprotected intercourse 2-3 days ago. She is breastfeeding and has had some light spotting but no menses.  She denies h/a, dizziness, shortness of breath, n/v, or fever/chills.    The following portions of the patient's history were reviewed and updated as appropriate: allergies, current medications, past family history, past medical history, past social history, past surgical history and problem list. Last pap smear on 02/14/21 was normal.  Health Maintenance Due  Topic Date Due   HPV VACCINES (1 - 2-dose series) Never done   INFLUENZA VACCINE  07/31/2021     Review of Systems:  Pertinent items are noted in HPI.   Objective:  Physical Exam Blood pressure 125/81, pulse 82, height 5\' 1"  (1.549 m), weight 154 lb (69.9 kg), last menstrual period 10/16/2020, currently breastfeeding. VS reviewed, nursing note reviewed,  Constitutional: well developed, well nourished, no distress HEENT: normocephalic CV: normal rate Pulm/chest wall: normal effort Breast Exam: deferred Abdomen: soft Neuro: alert and oriented x 3 Skin: warm, dry Psych: affect normal Pelvic exam: Deferred  Nexplanon Insertion Procedure Patient identified, informed consent performed, consent signed.   Patient does understand that irregular bleeding is a very common side effect of this medication. She was advised to have backup contraception for one week after placement. Pregnancy test in clinic today was negative.  Appropriate time out taken.  Patient's left arm was prepped and draped in the usual sterile fashion.. The ruler used to measure and mark insertion area.  Patient was prepped with alcohol swab and then injected with 3 ml of 1% lidocaine.  She was prepped with betadine, Nexplanon removed from packaging,  Device confirmed in needle, then inserted full length of needle and withdrawn per  handbook instructions. Nexplanon was able to palpated in the patient's arm; patient palpated the insert herself. There was minimal blood loss.  Patient insertion site covered with guaze and a pressure bandage to reduce any bruising.  The patient tolerated the procedure well and was given post procedure instructions.   Assessment & Plan:  1. Nexplanon insertion --Lot # [id number] --Pt reported unprotected intercourse 2-3 days ago, UPT negative today --Pt is breastfeeding and not having menses so pregnancy is unlikley --Pt counseled to take pregnancy test if she develops pregnancy symptoms --Given low risks, Nexplanon placed today --See procedure note above -Annual exam in 1 year  - POCT urine pregnancy - etonogestrel (NEXPLANON) implant 68 mg   [id number], CNM 11:34 AM

## 2021-08-28 NOTE — Progress Notes (Signed)
Pt is here today for nexplanon insertion.

## 2022-05-19 IMAGING — US US OB LIMITED
1 series · 5 of 5 positions shown · non-contrast
Comparison: none

[Series 1: us ob limited · 5 of 5 slices shown]
[im 1/5]
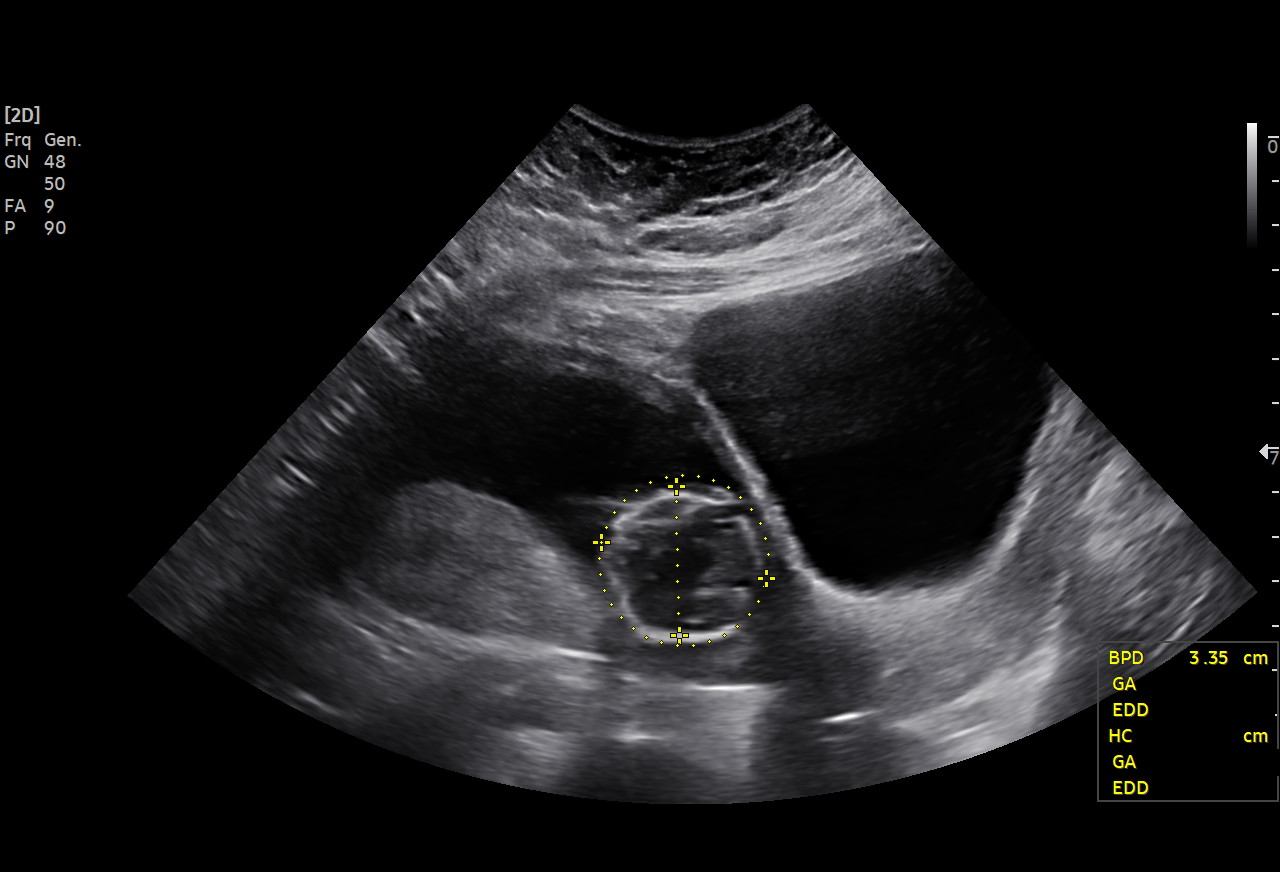
[im 2/5]
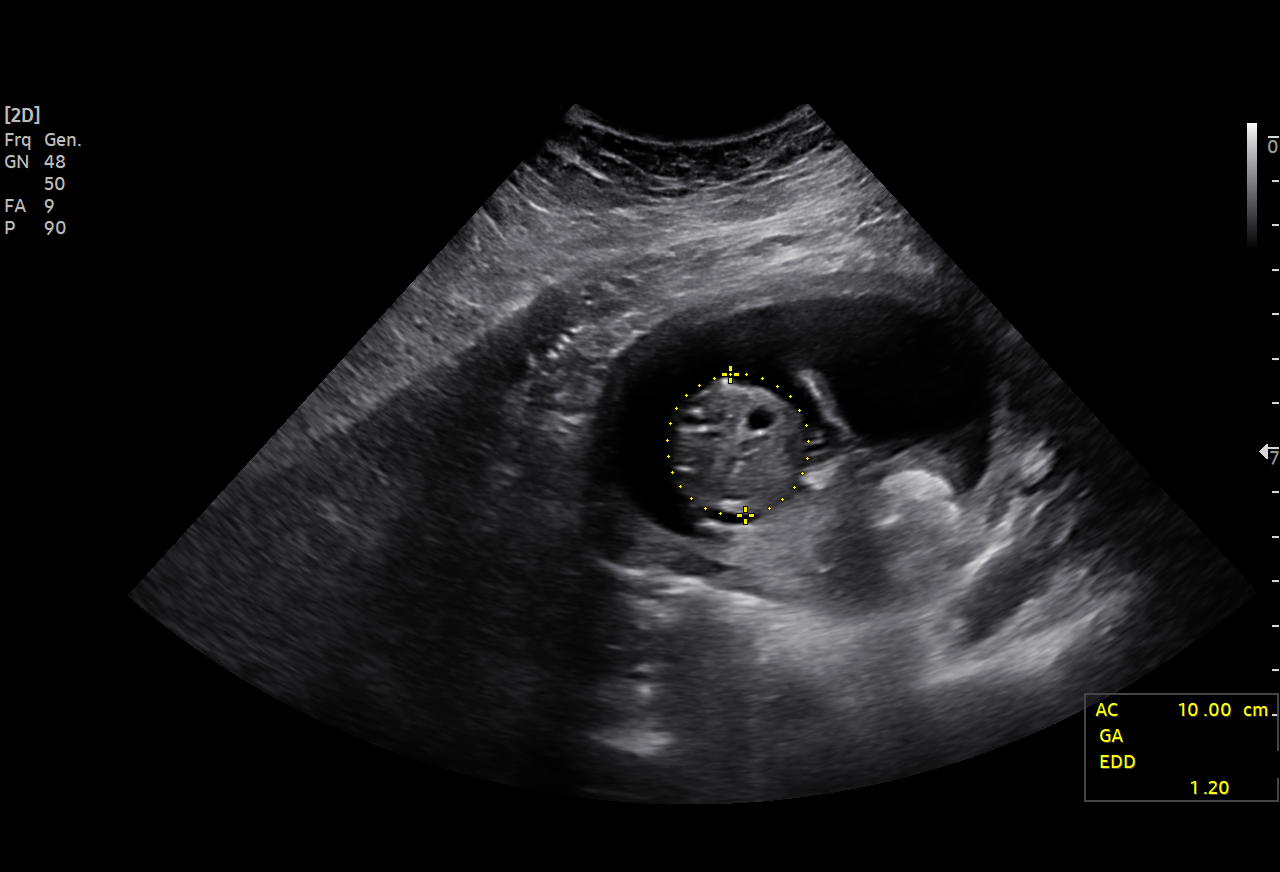
[im 3/5]
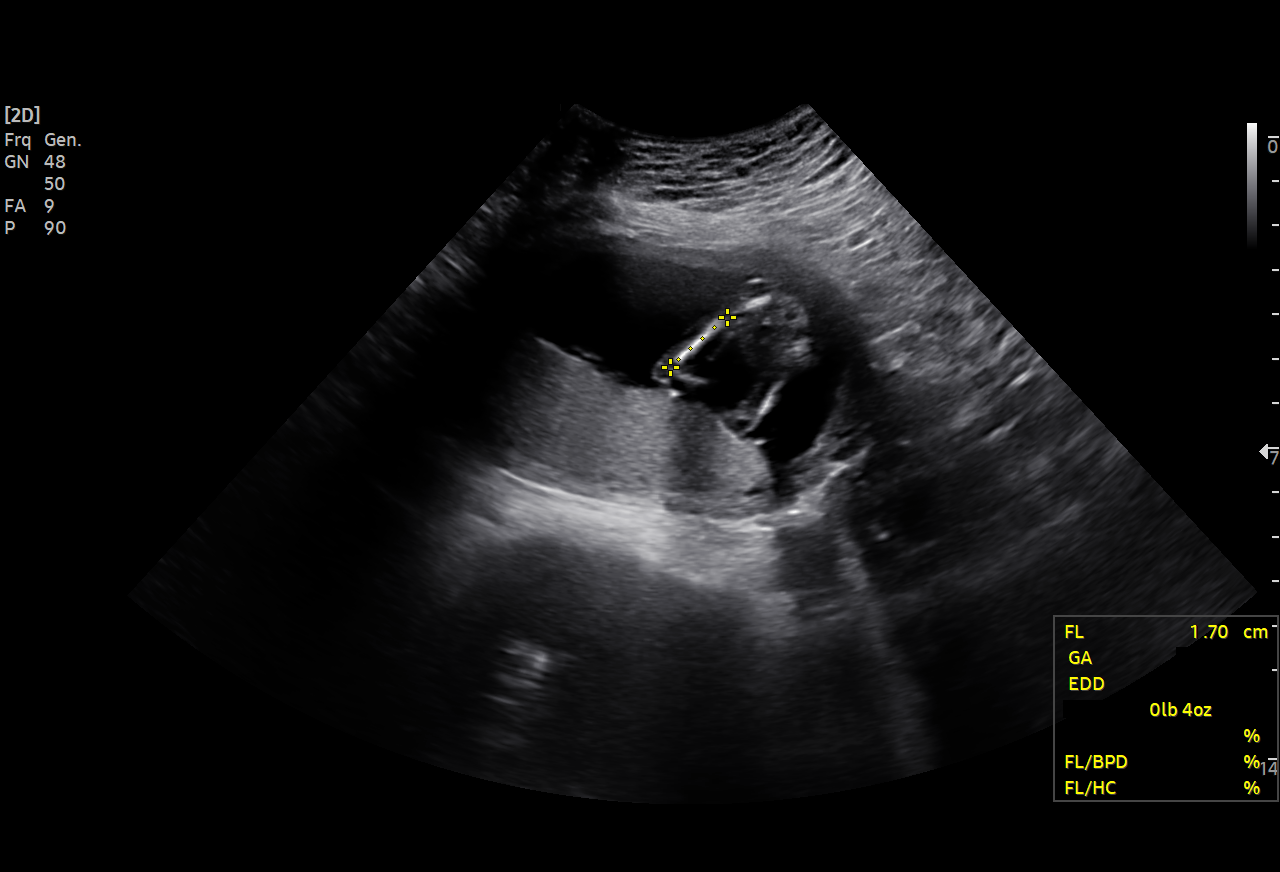
[im 4/5]
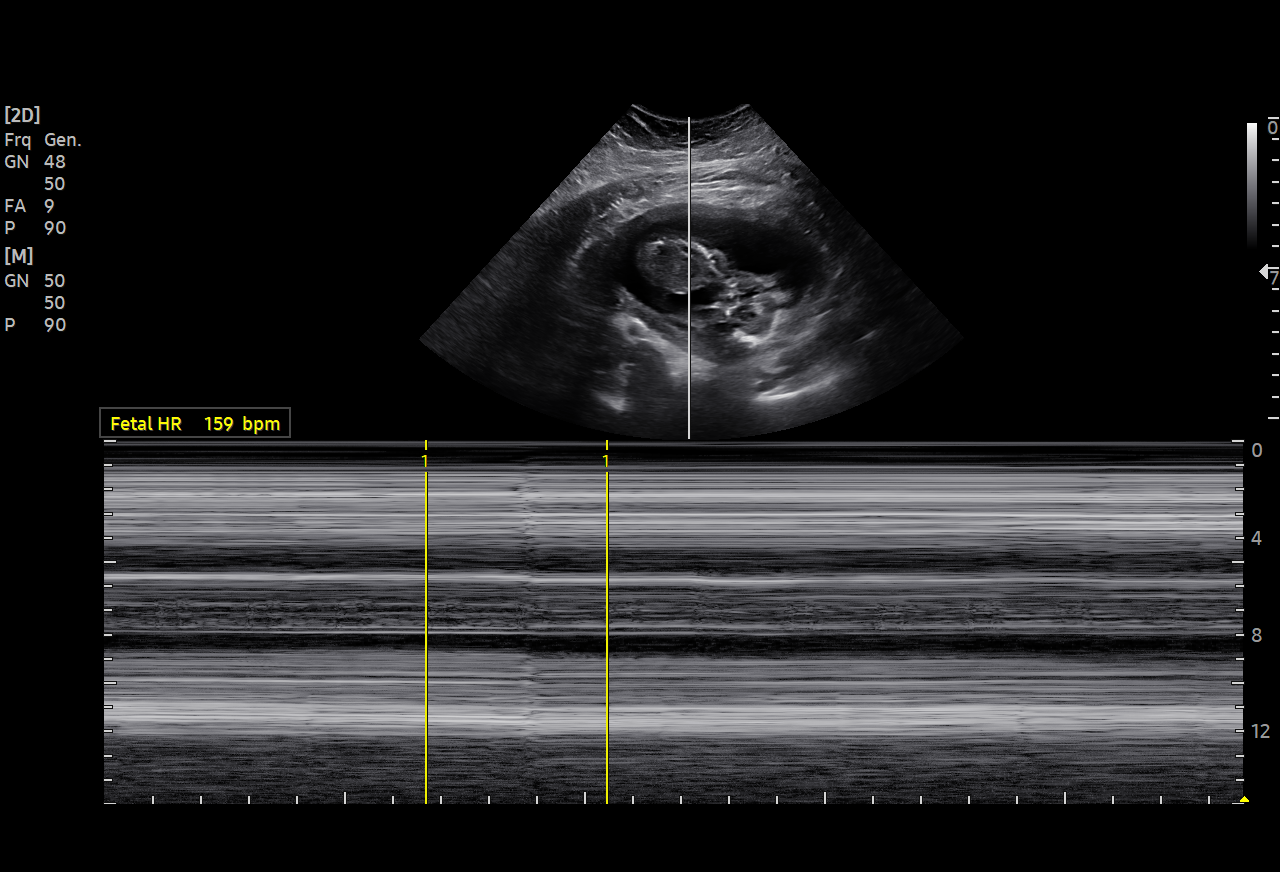
[im 5/5]
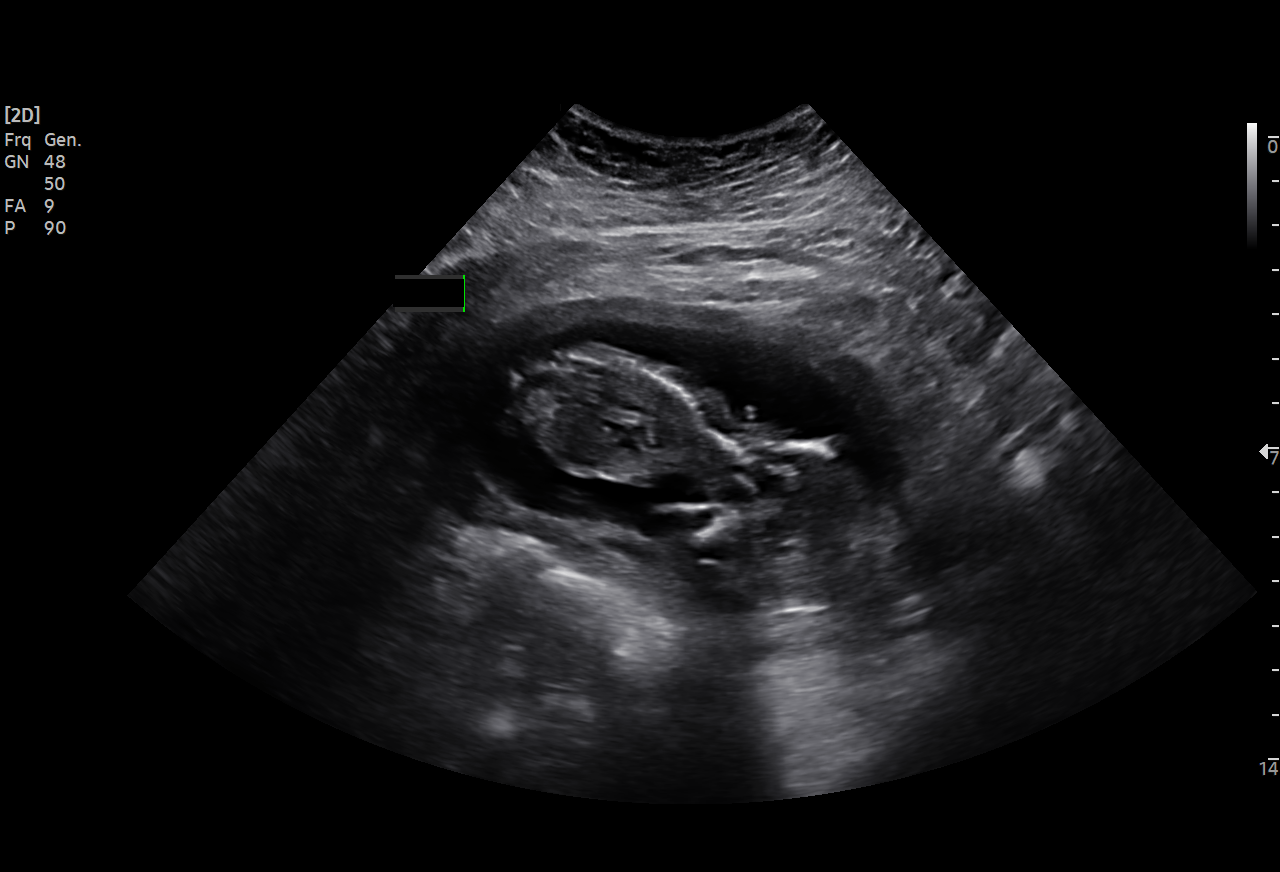

[5 of 5 positions shown; findings below may reference images not displayed]

[REDACTED]care
                                                              Healthcare

 1   [HOSPITAL]                        [address].0      DELORIS CALEL

Indications

 16 weeks gestation of pregnancy
 Encounter for uncertain dates
Fetal Evaluation

 Num Of Fetuses:          1
 Fetal Heart Rate(bpm):   159
 Cardiac Activity:        Observed

 Comment:     Scan for uncertain dates, EDC 07/14/21
Biometry

 BPD:      35.8   mm     G. Age:  17w 0d         88  %    CI:         88.66  %    70 - 86
                                                          FL/HC:       14.2  %    13.3 -
 HC:      119.6   mm     G. Age:  16w 0d         32  %    HC/AC:       1.20       1.05 -
 AC:        100   mm     G. Age:  16w 0d         53  %    FL/BPD:      47.5  %
 FL:         17   mm     G. Age:  15w 0d         12  %    FL/AC:       17.0  %    20 - 24

 Est. FW:     129   gm      0 lb 5 oz     18  %
Gestational Age

 U/S Today:      16w 0d                                         EDD:  07/14/21
 Best:           16w 0d    Det. By:  U/S (01/27/21)             EDD:  07/14/21
Comments

 Single live IUP at 95w1d by U/S. LMP uncure, but documented
 10/11/20

## 2022-06-22 IMAGING — US US MFM OB COMP +14 WKS
1 series · 14 of 28 positions shown · non-contrast
Comparison: none

[Series 1: us mfm ob comp +14 wks · 70 acquisitions, 14 frames shown]
[im 3/70]
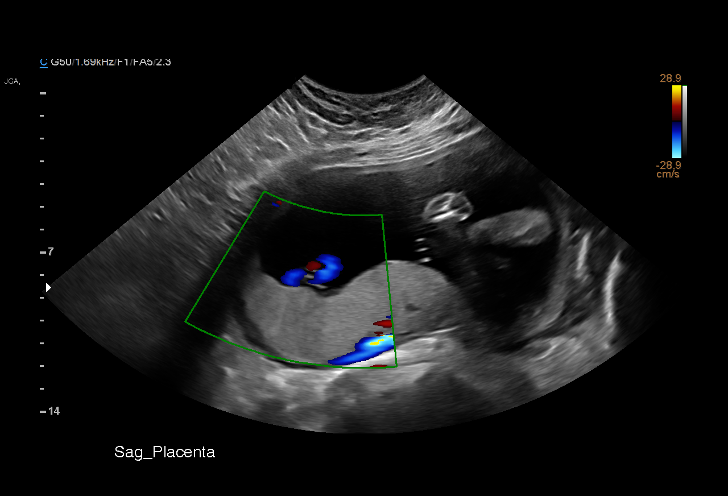
[im 8/70]
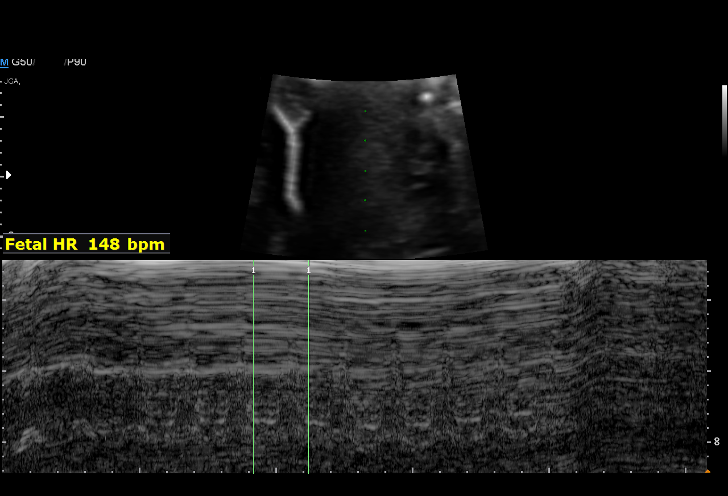
[im 13/70]
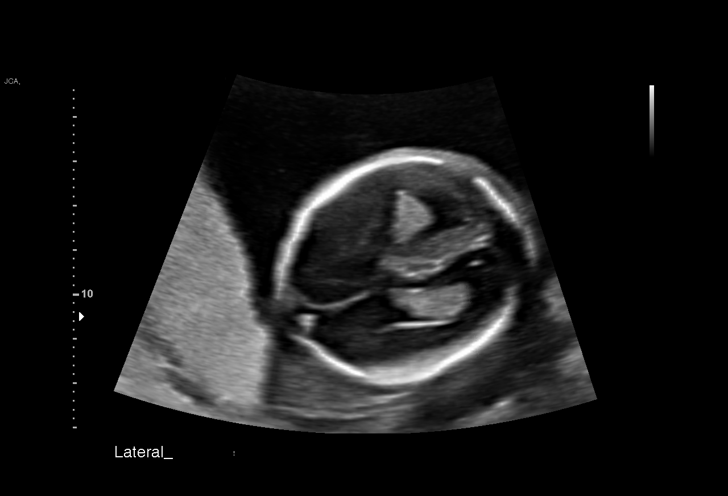
[im 18/70]
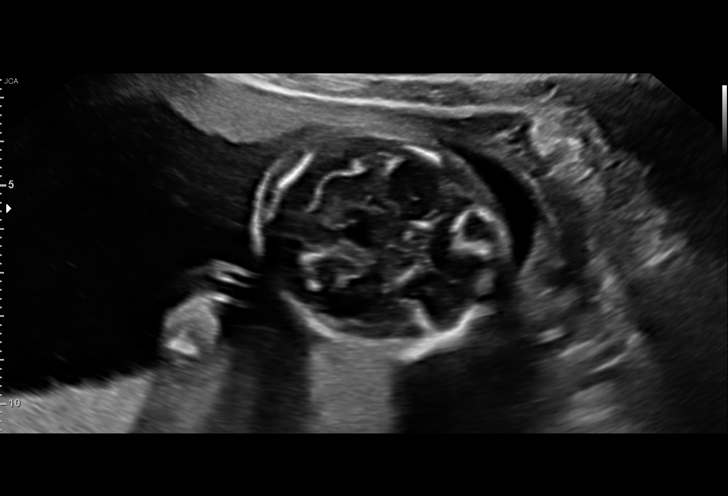
[im 24/70]
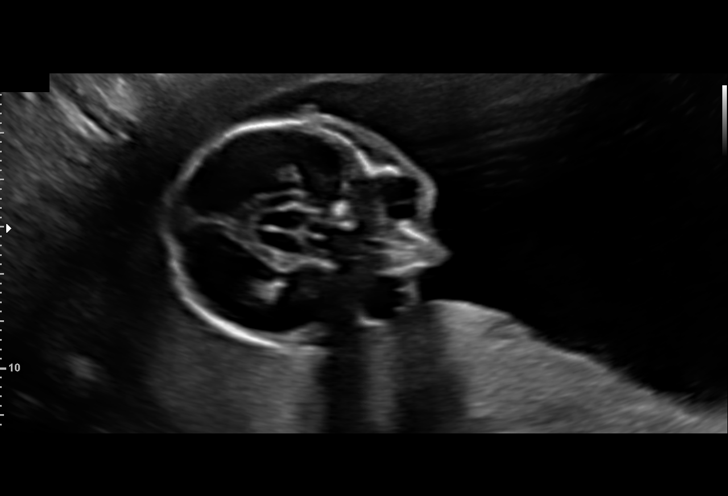
[im 29/70]
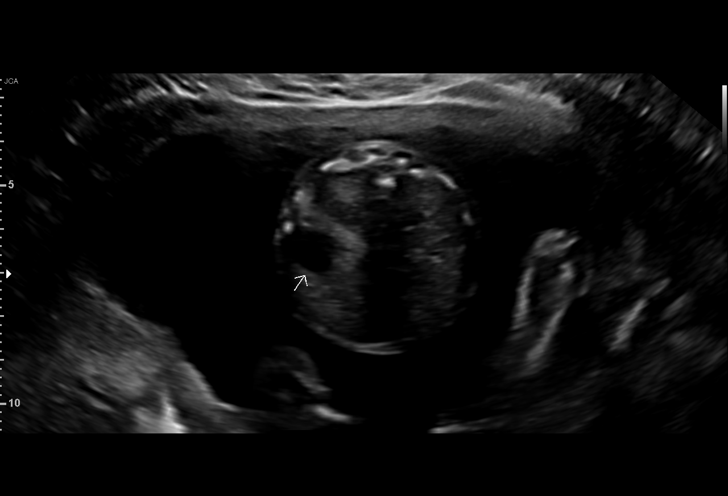
[im 34/70]
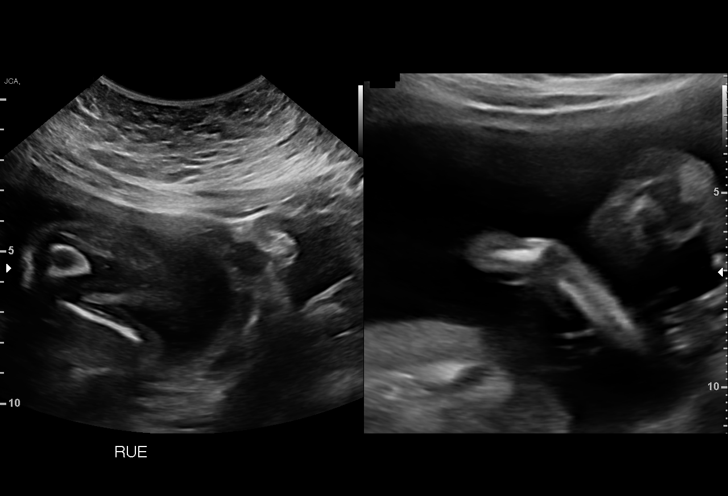
[im 39/70]
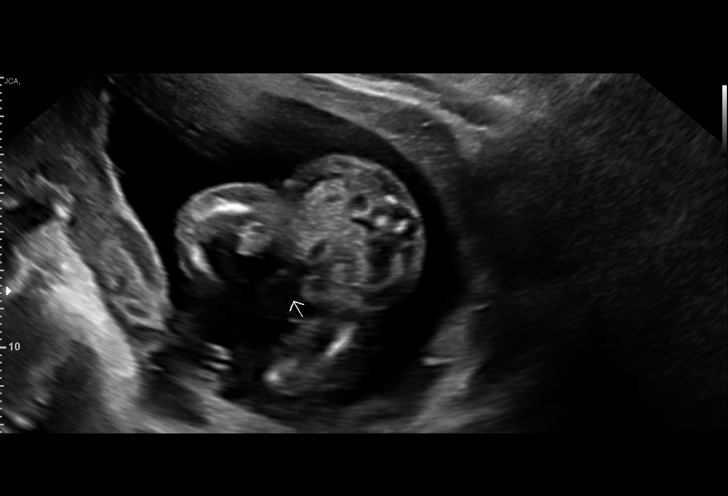
[im 44/70]
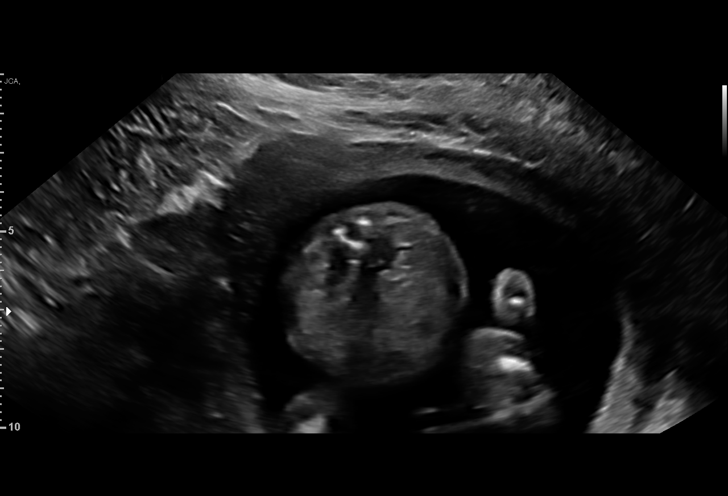
[im 49/70]
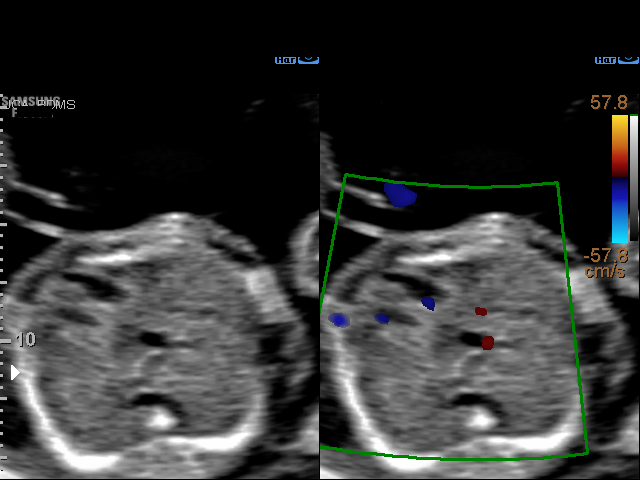
[im 54/70]
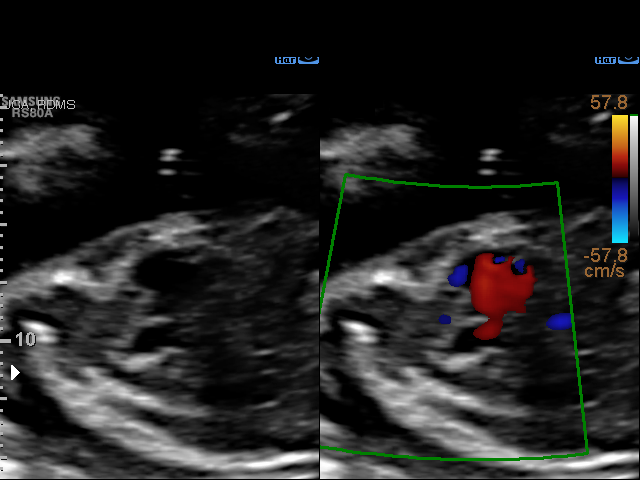
[im 59/70]
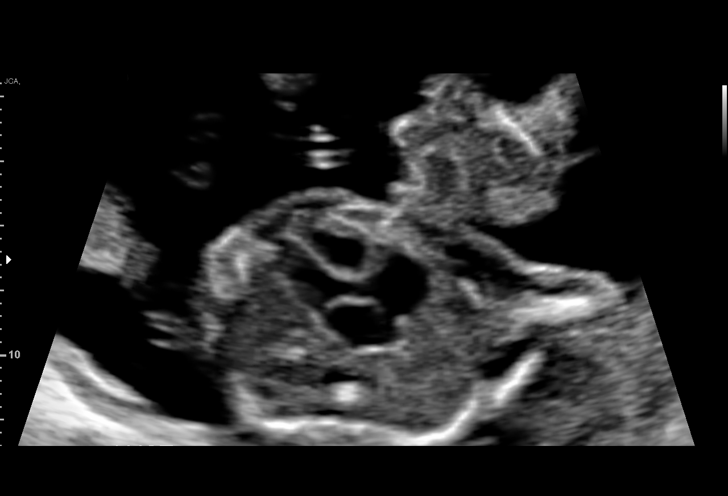
[im 64/70]
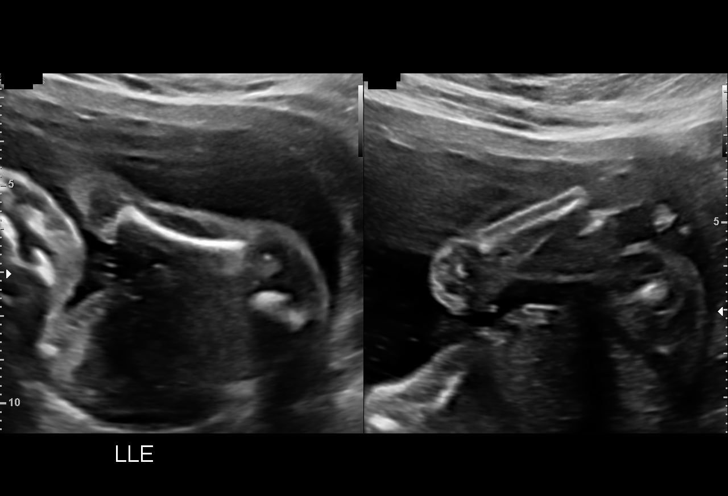
[im 70/70]
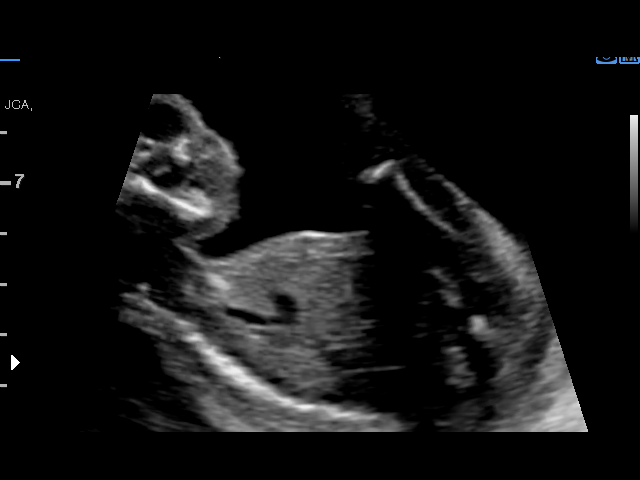

[14 of 28 positions shown; findings below may reference images not displayed]

[REDACTED]care

 1  US MFM OB COMP + 14 WK                76805.01    KLPIGBB MOOLMAN

Indications

 19 weeks gestation of pregnancy
 Encounter for antenatal screening for
 malformations
 Low Risk NIPS, Neg AFP
Fetal Evaluation

 Num Of Fetuses:         1
 Fetal Heart Rate(bpm):  148
 Cardiac Activity:       Observed
 Presentation:           Breech
 Placenta:               Posterior
 P. Cord Insertion:      Visualized

 Amniotic Fluid
 AFI FV:      Within normal limits

                             Largest Pocket(cm)

Biometry

 BPD:      47.3  mm     G. Age:  20w 2d         79  %    CI:        71.93   %    70 - 86
                                                         FL/HC:      18.9   %    16.8 -
 HC:      177.5  mm     G. Age:  20w 2d         73  %    HC/AC:      1.11        1.09 -
 AC:      159.5  mm     G. Age:  21w 0d         88  %    FL/BPD:     71.0   %
 FL:       33.6  mm     G. Age:  20w 4d         76  %    FL/AC:      21.1   %    20 - 24
 HUM:      32.5  mm     G. Age:  20w 6d         88  %
 CER:      19.9  mm     G. Age:  19w 2d         47  %
 NFT:       4.1  mm
 LV:          8  mm
 CM:        6.5  mm

 Est. FW:     374  gm    0 lb 13 oz      96  %
OB History

 Gravidity:    1         Term:   0
 Living:       0
Gestational Age

 LMP:           19w 4d        Date:  10/16/20                 EDD:   07/23/21
 U/S Today:     20w 4d                                        EDD:   07/16/21
 Best:          19w 4d     Det. By:  LMP  (10/16/20)          EDD:   07/23/21
Anatomy

 Cranium:               Appears normal         Aortic Arch:            Appears normal
 Cavum:                 Appears normal         Ductal Arch:            Appears normal
 Ventricles:            Appears normal         Diaphragm:              Appears normal
 Choroid Plexus:        Appears normal         Stomach:                Appears normal, left
                                                                       sided
 Cerebellum:            Appears normal         Abdomen:                Appears normal
 Posterior Fossa:       Appears normal         Abdominal Wall:         Appears nml (cord
                                                                       insert, abd wall)
 Nuchal Fold:           Appears normal         Cord Vessels:           Appears normal (3
                                                                       vessel cord)
 Face:                  Appears normal         Kidneys:                Appear normal
                        (orbits and profile)
 Lips:                  Appears normal         Bladder:                Appears normal
 Thoracic:              Appears normal         Spine:                  Appears normal
 Heart:                 Appears normal         Upper Extremities:      Appears normal
                        (4CH, axis, and
                        situs)
 RVOT:                  Appears normal         Lower Extremities:      Appears normal
 LVOT:                  Appears normal

 Other:  Nasal bone visualized. Fetus appears to be a male. Parents do not
         wish to know sex of fetus. Technically difficult due to fetal position.
Impression

 Single intrauterine pregnancy here for a detailed anatomy
 Normal anatomy with measurements consistent with dates
 There is good fetal movement and amniotic fluid volume
Recommendations

 Follow up as clinically indicated.

## 2024-09-01 ENCOUNTER — Ambulatory Visit: Payer: Self-pay

## 2024-09-01 VITALS — BP 126/75 | HR 85 | Ht 61.0 in | Wt 201.0 lb

## 2024-09-01 DIAGNOSIS — Z3009 Encounter for other general counseling and advice on contraception: Secondary | ICD-10-CM

## 2024-09-01 NOTE — Progress Notes (Signed)
 Pt presents for Nexplanon  removal due to expired device. Nexplanon  placed 07/2021  Last PAP 01/2021

## 2024-09-01 NOTE — Progress Notes (Signed)
   Subjective:    Patient ID: Annette Thompson, female    DOB: 1996/06/08, 28 y.o.   MRN: [id number]  HPI Charletha presents for Nexplanon  removal, placed 07/2021. Discussed that device approved for 5 years. Pt would like to keep the device in place at this time.     Objective:   Physical Exam BP 126/75   Pulse 85   Ht 5' 1 (1.549 m)   Wt 201 lb (91.2 kg)   BMI 37.98 kg/m  Gen: Alert, no acute distress CV: rate normal Resp: nonlabored    Assessment & Plan:  1. Encounter for counseling regarding contraception (Primary) Pt elects to keep Nexplanon  in place. Device lifetime 2027

## 2024-09-24 ENCOUNTER — Ambulatory Visit: Admitting: Physician Assistant
